# Patient Record
Sex: Female | Born: 1975 | Race: Black or African American | Hispanic: No | Marital: Single | Smoking: Never smoker
Health system: Southern US, Community
[De-identification: ages and names within clinical notes are randomized; demographics above are authoritative.]

## PROBLEM LIST (undated history)

## (undated) DIAGNOSIS — D649 Anemia, unspecified: Secondary | ICD-10-CM

---

## 2019-08-05 ENCOUNTER — Ambulatory Visit: Payer: Self-pay

## 2019-12-28 ENCOUNTER — Ambulatory Visit: Payer: Self-pay | Attending: Internal Medicine

## 2019-12-28 DIAGNOSIS — Z23 Encounter for immunization: Secondary | ICD-10-CM

## 2019-12-28 NOTE — Progress Notes (Signed)
   Covid-19 Vaccination Clinic  Name:  Julie Larson    MRN: [id number] DOB: 22-Jan-1976  12/28/2019  [name] was observed post Covid-19 immunization for 15 minutes without incident. She was provided with Vaccine Information Sheet and instruction to access the V-Safe system.   [name] was instructed to call 911 with any severe reactions post vaccine: Marland Kitchen Difficulty breathing  . Swelling of face and throat  . A fast heartbeat  . A bad rash all over body  . Dizziness and weakness   Immunizations Administered    Name Date Dose VIS Date Route   Moderna COVID-19 Vaccine 12/28/2019  2:35 PM 0.5 mL 04/2019 Intramuscular   Manufacturer: Moderna   Lot: [id number]   [place]: [id number]

## 2020-01-25 ENCOUNTER — Ambulatory Visit: Payer: Self-pay | Attending: Critical Care Medicine

## 2020-01-25 DIAGNOSIS — Z23 Encounter for immunization: Secondary | ICD-10-CM

## 2020-01-25 NOTE — Progress Notes (Signed)
   Covid-19 Vaccination Clinic  Name:  Julie Larson    MRN: [id number] DOB: 16-Jun-1975  01/25/2020  Julie Larson was observed post Covid-19 immunization for 15 minutes without incident. She was provided with Vaccine Information Sheet and instruction to access the V-Safe system.   Julie Larson was instructed to call 911 with any severe reactions post vaccine: Marland Kitchen Difficulty breathing  . Swelling of face and throat  . A fast heartbeat  . A bad rash all over body  . Dizziness and weakness   Immunizations Administered    Name Date Dose VIS Date Route   Moderna COVID-19 Vaccine 01/25/2020  2:02 PM 0.5 mL 04/2019 Intramuscular   Manufacturer: Moderna   Lot: [id number]   [place]: [id number]

## 2020-08-17 ENCOUNTER — Other Ambulatory Visit: Payer: Self-pay

## 2020-08-17 ENCOUNTER — Emergency Department (HOSPITAL_COMMUNITY): Payer: BC Managed Care – PPO

## 2020-08-17 ENCOUNTER — Encounter (HOSPITAL_COMMUNITY): Payer: Self-pay | Admitting: Internal Medicine

## 2020-08-17 ENCOUNTER — Inpatient Hospital Stay (HOSPITAL_COMMUNITY)
Admission: EM | Admit: 2020-08-17 | Discharge: 2020-08-19 | DRG: 812 | Disposition: A | Discharge status: Home / Self Care | Payer: BC Managed Care – PPO | Attending: Internal Medicine | Admitting: Internal Medicine

## 2020-08-17 DIAGNOSIS — R109 Unspecified abdominal pain: Secondary | ICD-10-CM

## 2020-08-17 DIAGNOSIS — R7989 Other specified abnormal findings of blood chemistry: Secondary | ICD-10-CM

## 2020-08-17 DIAGNOSIS — D649 Anemia, unspecified: Secondary | ICD-10-CM | POA: Diagnosis not present

## 2020-08-17 DIAGNOSIS — Z20822 Contact with and (suspected) exposure to covid-19: Secondary | ICD-10-CM | POA: Diagnosis present

## 2020-08-17 DIAGNOSIS — Z7951 Long term (current) use of inhaled steroids: Secondary | ICD-10-CM

## 2020-08-17 DIAGNOSIS — D75839 Thrombocytosis, unspecified: Secondary | ICD-10-CM | POA: Diagnosis not present

## 2020-08-17 DIAGNOSIS — D75838 Other thrombocytosis: Secondary | ICD-10-CM | POA: Diagnosis present

## 2020-08-17 DIAGNOSIS — D271 Benign neoplasm of left ovary: Secondary | ICD-10-CM

## 2020-08-17 DIAGNOSIS — R0602 Shortness of breath: Secondary | ICD-10-CM

## 2020-08-17 DIAGNOSIS — R600 Localized edema: Secondary | ICD-10-CM

## 2020-08-17 DIAGNOSIS — E8770 Fluid overload, unspecified: Secondary | ICD-10-CM | POA: Diagnosis present

## 2020-08-17 DIAGNOSIS — R1110 Vomiting, unspecified: Secondary | ICD-10-CM

## 2020-08-17 DIAGNOSIS — R188 Other ascites: Secondary | ICD-10-CM | POA: Diagnosis present

## 2020-08-17 DIAGNOSIS — D500 Iron deficiency anemia secondary to blood loss (chronic): Principal | ICD-10-CM | POA: Diagnosis present

## 2020-08-17 DIAGNOSIS — R112 Nausea with vomiting, unspecified: Secondary | ICD-10-CM

## 2020-08-17 DIAGNOSIS — N920 Excessive and frequent menstruation with regular cycle: Secondary | ICD-10-CM | POA: Diagnosis present

## 2020-08-17 DIAGNOSIS — D259 Leiomyoma of uterus, unspecified: Secondary | ICD-10-CM | POA: Diagnosis present

## 2020-08-17 DIAGNOSIS — D509 Iron deficiency anemia, unspecified: Secondary | ICD-10-CM | POA: Diagnosis not present

## 2020-08-17 DIAGNOSIS — I509 Heart failure, unspecified: Secondary | ICD-10-CM

## 2020-08-17 DIAGNOSIS — E876 Hypokalemia: Secondary | ICD-10-CM | POA: Diagnosis present

## 2020-08-17 LAB — CBC WITH DIFFERENTIAL/PLATELET
Abs Immature Granulocytes: 0.09 10*3/uL — ABNORMAL HIGH (ref 0.00–0.07)
Basophils Absolute: 0 10*3/uL (ref 0.0–0.1)
Basophils Relative: 0 %
Eosinophils Absolute: 0.1 10*3/uL (ref 0.0–0.5)
Eosinophils Relative: 1 %
HCT: 9.8 % — ABNORMAL LOW (ref 36.0–46.0)
Hemoglobin: 2.5 g/dL — CL (ref 12.0–15.0)
Immature Granulocytes: 1 %
Lymphocytes Relative: 14 %
Lymphs Abs: 1.4 10*3/uL (ref 0.7–4.0)
MCH: 16.8 pg — ABNORMAL LOW (ref 26.0–34.0)
MCHC: 25.5 g/dL — ABNORMAL LOW (ref 30.0–36.0)
MCV: 65.8 fL — ABNORMAL LOW (ref 80.0–100.0)
Monocytes Absolute: 1.1 10*3/uL — ABNORMAL HIGH (ref 0.1–1.0)
Monocytes Relative: 11 %
Neutro Abs: 7.8 10*3/uL — ABNORMAL HIGH (ref 1.7–7.7)
Neutrophils Relative %: 73 %
Platelets: 1235 10*3/uL (ref 150–400)
RBC: 1.49 MIL/uL — ABNORMAL LOW (ref 3.87–5.11)
RDW: 38.3 % — ABNORMAL HIGH (ref 11.5–15.5)
WBC: 10.4 10*3/uL (ref 4.0–10.5)
nRBC: 0.4 % — ABNORMAL HIGH (ref 0.0–0.2)

## 2020-08-17 LAB — PROTIME-INR
INR: 1.2 (ref 0.8–1.2)
Prothrombin Time: 14.8 seconds (ref 11.4–15.2)

## 2020-08-17 LAB — RESP PANEL BY RT-PCR (FLU A&B, COVID) ARPGX2
Influenza A by PCR: NEGATIVE
Influenza B by PCR: NEGATIVE
SARS Coronavirus 2 by RT PCR: NEGATIVE

## 2020-08-17 LAB — COMPREHENSIVE METABOLIC PANEL
ALT: 29 U/L (ref 0–44)
AST: 19 U/L (ref 15–41)
Albumin: 3.4 g/dL — ABNORMAL LOW (ref 3.5–5.0)
Alkaline Phosphatase: 79 U/L (ref 38–126)
Anion gap: 8 (ref 5–15)
BUN: <5 mg/dL — ABNORMAL LOW (ref 6–20)
CO2: 24 mmol/L (ref 22–32)
Calcium: 8.4 mg/dL — ABNORMAL LOW (ref 8.9–10.3)
Chloride: 106 mmol/L (ref 98–111)
Creatinine, Ser: 0.63 mg/dL (ref 0.44–1.00)
GFR, Estimated: >60 mL/min (ref >60)
Glucose, Bld: 112 mg/dL — ABNORMAL HIGH (ref 70–99)
Potassium: 3 mmol/L — ABNORMAL LOW (ref 3.5–5.1)
Sodium: 138 mmol/L (ref 135–145)
Total Bilirubin: 0.5 mg/dL (ref 0.3–1.2)
Total Protein: 7.3 g/dL (ref 6.5–8.1)

## 2020-08-17 LAB — PREPARE RBC (CROSSMATCH)

## 2020-08-17 LAB — ABO/RH: ABO/RH(D): A POS

## 2020-08-17 LAB — RETICULOCYTES
Immature Retic Fract: 12.6 % (ref 2.3–15.9)
RBC.: 1.43 MIL/uL — ABNORMAL LOW (ref 3.87–5.11)
Retic Count, Absolute: 13.3 10*3/uL — ABNORMAL LOW (ref 19.0–186.0)
Retic Ct Pct: 0.9 % (ref 0.4–3.1)

## 2020-08-17 LAB — FOLATE: Folate: 25.7 ng/mL (ref >5.9)

## 2020-08-17 LAB — IRON AND TIBC
Iron: 7 ug/dL — ABNORMAL LOW (ref 28–170)
Saturation Ratios: 1 % — ABNORMAL LOW (ref 10.4–31.8)
TIBC: 475 ug/dL — ABNORMAL HIGH (ref 250–450)
UIBC: 468 ug/dL

## 2020-08-17 LAB — BRAIN NATRIURETIC PEPTIDE: B Natriuretic Peptide: 295.9 pg/mL — ABNORMAL HIGH (ref 0.0–100.0)

## 2020-08-17 LAB — I-STAT BETA HCG BLOOD, ED (MC, WL, AP ONLY): I-stat hCG, quantitative: <5 m[IU]/mL (ref <5)

## 2020-08-17 LAB — VITAMIN B12: Vitamin B-12: 633 pg/mL (ref 180–914)

## 2020-08-17 LAB — FERRITIN: Ferritin: 2 ng/mL — ABNORMAL LOW (ref 11–307)

## 2020-08-17 MED ORDER — SODIUM CHLORIDE 0.9 % IV SOLN
25.0000 mg | Freq: Once | INTRAVENOUS | Status: DC
  Filled 2020-08-17: qty 0.5

## 2020-08-17 MED ORDER — SODIUM CHLORIDE 0.9 % IV SOLN
1500.0000 mg | Freq: Once | INTRAVENOUS | Status: AC
  Administered 2020-08-18: 1500 mg via INTRAVENOUS
  Filled 2020-08-17 (×3): qty 30

## 2020-08-17 MED ORDER — PANTOPRAZOLE SODIUM 40 MG IV SOLR
40.0000 mg | Freq: Once | INTRAVENOUS | Status: AC
  Administered 2020-08-17: 40 mg via INTRAVENOUS
  Filled 2020-08-17: qty 40

## 2020-08-17 MED ORDER — SODIUM CHLORIDE 0.9 % IV SOLN
1500.0000 mg | Freq: Once | INTRAVENOUS | Status: DC
  Filled 2020-08-17: qty 30

## 2020-08-17 MED ORDER — SODIUM CHLORIDE 0.9 % IV SOLN
25.0000 mg | Freq: Once | INTRAVENOUS | Status: AC
  Administered 2020-08-18: 25 mg via INTRAVENOUS
  Filled 2020-08-17: qty 0.5

## 2020-08-17 MED ORDER — ACETAMINOPHEN 650 MG RE SUPP: 650.0000 mg | Freq: Four times a day (QID) | RECTAL | Status: DC | PRN

## 2020-08-17 MED ORDER — POTASSIUM CHLORIDE CRYS ER 20 MEQ PO TBCR
40.0000 meq | EXTENDED_RELEASE_TABLET | ORAL | Status: AC
  Administered 2020-08-17 – 2020-08-18 (×2): 40 meq via ORAL
  Filled 2020-08-17 (×2): qty 2

## 2020-08-17 MED ORDER — FOLIC ACID 1 MG PO TABS
2.0000 mg | ORAL_TABLET | Freq: Every day | ORAL | Status: DC
  Administered 2020-08-18 – 2020-08-19 (×2): 2 mg via ORAL
  Filled 2020-08-17 (×2): qty 2

## 2020-08-17 MED ORDER — ACETAMINOPHEN 325 MG PO TABS: 650.0000 mg | ORAL_TABLET | Freq: Four times a day (QID) | ORAL | Status: DC | PRN

## 2020-08-17 MED ORDER — FERROUS GLUCONATE 324 (38 FE) MG PO TABS
324.0000 mg | ORAL_TABLET | Freq: Every day | ORAL | Status: DC
  Administered 2020-08-18 – 2020-08-19 (×2): 324 mg via ORAL
  Filled 2020-08-17 (×2): qty 1

## 2020-08-17 MED ORDER — SODIUM CHLORIDE 0.9 % IV SOLN
10.0000 mL/h | Freq: Once | INTRAVENOUS | Status: AC
  Administered 2020-08-17: 10 mL/h via INTRAVENOUS

## 2020-08-17 MED ORDER — ENOXAPARIN SODIUM 40 MG/0.4ML ~~LOC~~ SOLN: 40.0000 mg | SUBCUTANEOUS | Status: DC

## 2020-08-17 MED ORDER — FUROSEMIDE 20 MG PO TABS
20.0000 mg | ORAL_TABLET | Freq: Once | ORAL | Status: AC
  Administered 2020-08-18: 20 mg via ORAL
  Filled 2020-08-17: qty 1

## 2020-08-17 MED ORDER — SODIUM CHLORIDE 0.9 % IV SOLN
40.0000 mg | Freq: Two times a day (BID) | INTRAVENOUS | Status: DC
  Filled 2020-08-17: qty 4

## 2020-08-17 NOTE — H&P (Signed)
History and Physical    Julie Larson FAO:[id number] DOB: 12/07/1975 DOA: 08/17/2020  PCP: Lucianne Lei, MD (Confirm with patient/family/NH records and if not entered, this has to be entered at [facility] point of entry) Patient coming from: Home  I have personally briefly reviewed patient's old medical records in [facility]  Chief Complaint: SOB  HPI: Julie Larson is a 45 y.o. female with medical history significant of menorrhagia, presented with, increasing shortness of breath.  Patient has history of heavy menstrual for last 2 years, menstrual period usually last 7 to 10 days, with 3 days very heavy along with low abdominal cramping.  She never set up her primary care so she never went to seek medical attention.  For last 4-month, patient has experienced increasing fatigue and shortness of breath on climbing stairs.  She denied any new abdominal pain, no blood in stool or dark colored stool.  She also admitted occasionally coughing up whitish colored sputum, and she will developed leg swelling over the last couple months.  She went to see her PCP yesterday, PCP diagnosed her with the asthma and prescribed her with inhalers and blood work showed hemoglobin 2.4.  ED Course: Hb 2.5, WBC 1.4, platelet 1235.  Checks x-ray showed lung congested.  Review of Systems: As per HPI otherwise 10 point review of systems negative.   No past medical history on file.     has no history on file for tobacco use, alcohol use, and drug use.  No Known Allergies  No family history on file.   Prior to Admission medications   Medication Sig Start Date End Date Taking? Authorizing Provider  Fluticasone-Umeclidin-Vilant (TRELEGY ELLIPTA) 100-62.5-25 MCG/INH AEPB Inhale 1 puff into the lungs daily.   Yes [provider]  albuterol (VENTOLIN HFA) 108 (90 Base) MCG/ACT inhaler Inhale 1-2 puffs into the lungs as needed for wheezing or shortness of breath. Patient not taking: No sig reported 08/16/20    [provider]  ondansetron (ZOFRAN) 4 MG tablet Take 4 mg by mouth every 8 (eight) hours as needed for nausea/vomiting. Patient not taking: No sig reported 08/16/20   [provider]    Physical Exam: Vitals:   08/17/20 1815 08/17/20 1830 08/17/20 1845 08/17/20 1904  BP: 108/67 108/64 109/60 110/62  Pulse: (!) 102 97 92 88  Resp: 16 (!) 28 16 18   Temp:    98.2 F (36.8 C)  TempSrc:    Oral  SpO2: 100% 100% 100% 100%  Weight:      Height:        Constitutional: NAD, calm, comfortable Vitals:   08/17/20 1815 08/17/20 1830 08/17/20 1845 08/17/20 1904  BP: 108/67 108/64 109/60 110/62  Pulse: (!) 102 97 92 88  Resp: 16 (!) 28 16 18   Temp:    98.2 F (36.8 C)  TempSrc:    Oral  SpO2: 100% 100% 100% 100%  Weight:      Height:       Eyes: PERRL, lids and conjunctivae pale ENMT: Mucous membranes are moist. Posterior pharynx clear of any exudate or lesions.Normal dentition.  Neck: normal, supple, no masses, no thyromegaly Respiratory: clear to auscultation bilaterally, no wheezing, fine crackles on bilateral bases. Normal respiratory effort. No accessory muscle use.  Cardiovascular: Regular rate and rhythm, no murmurs / rubs / gallops. 1+ extremity edema. 2+ pedal pulses. No carotid bruits.  Abdomen: no tenderness, no masses palpated. No hepatosplenomegaly. Bowel sounds positive.  Musculoskeletal: no clubbing / cyanosis. No joint deformity  upper and lower extremities. Good ROM, no contractures. Normal muscle tone.  Skin: no rashes, lesions, ulcers. No induration Neurologic: CN 2-12 grossly intact. Sensation intact, DTR normal. Strength 5/5 in all 4.  Psychiatric: Normal judgment and insight. Alert and oriented x 3. Normal mood.     Labs on Admission: I have personally reviewed following labs and imaging studies  CBC: Recent Labs  Lab 08/17/20 1511  WBC 10.4  NEUTROABS 7.8*  HGB 2.5*  HCT 9.8*  MCV 65.8*  PLT 0,160*   Basic Metabolic Panel: Recent  Labs  Lab 08/17/20 1511  NA 138  K 3.0*  CL 106  CO2 24  GLUCOSE 112*  BUN <5*  CREATININE 0.63  CALCIUM 8.4*   GFR: Estimated Creatinine Clearance: 63.8 mL/min (by C-G formula based on SCr of 0.63 mg/dL). Liver Function Tests: Recent Labs  Lab 08/17/20 1511  AST 19  ALT 29  ALKPHOS 79  BILITOT 0.5  PROT 7.3  ALBUMIN 3.4*   No results for input(s): LIPASE, AMYLASE in the last 168 hours. No results for input(s): AMMONIA in the last 168 hours. Coagulation Profile: Recent Labs  Lab 08/17/20 1511  INR 1.2   Cardiac Enzymes: No results for input(s): CKTOTAL, CKMB, CKMBINDEX, TROPONINI in the last 168 hours. BNP (last 3 results) No results for input(s): PROBNP in the last 8760 hours. HbA1C: No results for input(s): HGBA1C in the last 72 hours. CBG: No results for input(s): GLUCAP in the last 168 hours. Lipid Profile: No results for input(s): CHOL, HDL, LDLCALC, TRIG, CHOLHDL, LDLDIRECT in the last 72 hours. Thyroid Function Tests: No results for input(s): TSH, T4TOTAL, FREET4, T3FREE, THYROIDAB in the last 72 hours. Anemia Panel: Recent Labs    08/17/20 1523  VITAMINB12 633  FOLATE 25.7  FERRITIN 2*  TIBC 475*  IRON 7*  RETICCTPCT 0.9   Urine analysis: No results found for: COLORURINE, APPEARANCEUR, LABSPEC, PHURINE, GLUCOSEU, HGBUR, BILIRUBINUR, KETONESUR, PROTEINUR, UROBILINOGEN, NITRITE, LEUKOCYTESUR  Radiological Exams on Admission: DG Chest Port 1 View  Result Date: 08/17/2020 CLINICAL DATA:  Peripheral swelling and decreased hemoglobin EXAM: PORTABLE CHEST 1 VIEW COMPARISON:  None. FINDINGS: Cardiac shadow is mildly enlarged. Lungs are well aerated bilaterally with small pleural effusions bilaterally. No focal infiltrate is seen. No bony abnormality is noted. IMPRESSION: Small effusions bilaterally. Electronically Signed   By: Inez Catalina M.D.   On: 08/17/2020 16:13    EKG: Independently reviewed.  Sinus tachycardia  Assessment/Plan Active  Problems:   Anemia  (please populate well all problems here in Problem List. (For example, if patient is on BP meds at home and you resume or decide to hold them, it is a problem that needs to be her. 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 -Discussed with patient regarding use of birth control pill, patient does have family-planning, will defer to PCP/OB.  Refer to outpatient OB follow-up. Ultrasound pending. -PRBC x2, and transfusion x1, 1 dose of Lasix bolus.  Start p.o. iron tomorrow.  Decompensated CHF -Likely high-output CHF secondary to chronic severe anemia. -Some signs of fluid overload, crackles in the lungs and peripheral edema.  1 dose of Lasix after transfusion and reevaluate tomorrow. -Recommend follow-up with PCP in 1 to 2 weeks to decide whether she needs echo outpatient. -Repeat chest x-ray in a.m. -TSH  Hypokalemia -P.o. replacement -Recheck level in a.m., check magnesium and  phosphorus level.  Menorrhagia and dysmenorrhea -Refer to outpatient OB/GYN.  Question of asthma -Continue PRN inhalers  DVT prophylaxis: SCD Code Status: Full Code Family Communication: None at bedside Disposition Plan: Expect less than 2 midnight hospital stay, likely can go home tomorrow after PRBC transfusion and iron transfusion. Consults called: Hematology Admission status: MedSurg Obs   Lequita Halt MD [facility] Pager [phone]  08/17/2020, 7:11 PM

## 2020-08-17 NOTE — ED Provider Notes (Signed)
Medical screening examination/treatment/procedure(s) were conducted as a shared visit with non-physician practitioner(s) and myself.  I personally evaluated the patient during the encounter.  Clinical Impression:   Final diagnoses:  Iron deficiency anemia, unspecified iron deficiency anemia type  Hypokalemia  Lower extremity edema  Elevated brain natriuretic peptide (BNP) level    This patient is a ill-appearing 45 year old female who up to this point had been otherwise healthy.  She has not seen a doctor in many years but because of progressive fatigue and weakness over the last several months she went to a doctor to establish care, blood work was drawn which showed a hemoglobin that was severely depleted at less than 3 and she was redirected to the emergency department.  She actually endorses having some shortness of breath with exertion, some lightheadedness and weakness, on exam the patient is tachycardic to 115, has mild edema, has JVD in the semirecumbent position, her abdomen is mildly tender in the lower abdomen mostly on the left side her nailbeds are pale, conjunctive are pale, mucous membranes are pale.  She speaks in full sentences, has normal mentation, her strength is 5 of 5 in all extremities.  This conglomerate of symptoms really is suggestive of acute anemia, she does endorse having heavy menstrual cycles for quite some time but has never been checked out for it.  She denies having any gastrointestinal losses and has never had any hematologic diseases.  We will get lab studies, chest x-ray, IV access, transfusion, admit, this patient is critically ill with severe anemia  Mulriple lab abnormalities, needs admit with w/u for source.  Critically ill   Noemi Chapel, MD 08/18/20 [phone]

## 2020-08-17 NOTE — Consult Note (Signed)
Referral MD  Reason for Referral: Severe microcytic anemia-iron deficiency  Chief Complaint  Patient presents with  . Low Hemoglobin  : I just been feeling tired.  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  She is a Programme researcher, broadcasting/film/video at [employer].  She has been there for 5 years.  She was in [place] where she did her collegiate years.  She has never seen a doctor.  I am just surprised by this.  She has never had a mammogram.  She was just feeling tired and weak.  She ultimately went to see Dr. Criss Rosales who, being incredibly thorough, did blood work on her and obviously found that she was markedly anemic.  Dr. Tamala Julian has not had any bleeding outside of her monthly cycles.  She says that her monthly cycles have not been all that heavy.  She does not recall chewing ice.  She has had a hard time going up her stairs at her home because of shortness of breath and fatigue.  She was brought to the emergency room by paramedics who actually came out to her house.  She was found to be markedly anemic.  Her CBC showed a white count 10.4.  Hemoglobin 2.5.  Retacrit 9.8.  Platelet count 1.2 million.  Her reticulocyte count corrected was basically 0.  She had a normal white blood cell differential.  I looked at her blood smear.  She had some spherocytes.  She had marked anisocytosis and poikilocytosis.  I did not see any nucleated red blood cells.  She had no immature myeloid or lymphoid cells.  I saw no hypersegmented polys.  There were no blasts.  There was no rouleaux formation.  There is no red cell clumping.  Platelets were the increased in number.  She had a couple large platelets.  There is no history of anemia in her family.  She never had surgery.  She has never had problems with smoking.  She I think is rare alcohol use.  Of interest, the fact that she has been throwing up.  She been throwing up since early February.  I am not sure why she has been throwing up.   She has lost weight.  She is not a vegetarian.  However she says she does not eat a lot of meat.  She does not pregnant.  She has never been pregnant.  Currently, her performance status is ECOG 1.    No past medical history on file.::   Current Facility-Administered Medications:  .  famotidine (PEPCID) 40 mg in sodium chloride 0.9 % 50 mL IVPB, 40 mg, Intravenous, Q12H, Almeda Ezra, Rudell Cobb, MD .  folic acid (FOLVITE) tablet 2 mg, 2 mg, Oral, Daily, Xavier Fournier, Rudell Cobb, MD .  Derrill Memo ON 08/18/2020] iron dextran complex (INFED) 25 mg in sodium chloride 0.9 % 50 mL IVPB, 25 mg, Intravenous, Once **FOLLOWED BY** [START ON 08/18/2020] iron dextran complex (INFED) 1,500 mg in sodium chloride 0.9 % 500 mL IVPB, 1,500 mg, Intravenous, Once, Kayline Sheer, Rudell Cobb, MD  Current Outpatient Medications:  .  Fluticasone-Umeclidin-Vilant (TRELEGY ELLIPTA) 100-62.5-25 MCG/INH AEPB, Inhale 1 puff into the lungs daily., Disp: , Rfl:  .  albuterol (VENTOLIN HFA) 108 (90 Base) MCG/ACT inhaler, Inhale 1-2 puffs into the lungs as needed for wheezing or shortness of breath. (Patient not taking: No sig reported), Disp: , Rfl:  .  ondansetron (ZOFRAN) 4 MG tablet, Take 4 mg by mouth every 8 (eight) hours as needed for nausea/vomiting. (Patient  not taking: No sig reported), Disp: , Rfl: :  . folic acid  2 mg Oral Daily  :  No Known Allergies:  No family history on file.:  Social History   Socioeconomic History  . Marital status: Single    Spouse name: Not on file  . Number of children: Not on file  . Years of education: Not on file  . Highest education level: Not on file  Occupational History  . Not on file  Tobacco Use  . Smoking status: Not on file  . Smokeless tobacco: Not on file  Substance and Sexual Activity  . Alcohol use: Not on file  . Drug use: Not on file  . Sexual activity: Not on file  Other Topics Concern  . Not on file  Social History Narrative  . Not on file   Social Determinants of Health    Financial Resource Strain: Not on file  Food Insecurity: Not on file  Transportation Needs: Not on file  Physical Activity: Not on file  Stress: Not on file  Social Connections: Not on file  Intimate Partner Violence: Not on file  :  Review of Systems  Constitutional: Positive for malaise/fatigue and weight loss.  HENT: Negative.   Eyes: Negative.   Respiratory: Negative.   Cardiovascular: Negative.   Gastrointestinal: Positive for abdominal pain and vomiting.  Genitourinary: Negative.   Musculoskeletal: Negative.   Skin: Negative.   Neurological: Negative.   Endo/Heme/Allergies: Negative.   Psychiatric/Behavioral: Negative.      Exam:  This is a fairly well-developed and well-nourished African-American female in no obvious distress.  Vital signs show a temperature of 97.  Pulse 106.  Blood pressure 108/60.  Weight is 110 pounds.  Head neck exam shows pale conjunctiva.  There is no scleral icterus.  She has no adenopathy in the neck.  Thyroid is nonpalpable.  Lungs are clear bilaterally.  Cardiac exam is tachycardic but regular.  She has a 1/6 systolic ejection murmur.  Abdomen is soft.  Bowel sounds are decreased.  There is no guarding or rebound tenderness.  She has no obvious fluid wave.  There is no obvious abdominal mass.  There is no palpable liver or spleen tip.  Back exam shows no tenderness over the spine, ribs or hips.  Extremities show some swelling in the lower extremities close to her ankles.  She has good pulses in her distal extremities.  Neurological exam is nonfocal.  She is intact to sensation.  She has good range of motion of her joints.  Cranial nerves are intact.  Skin exam shows no rashes, ecchymoses or petechia.  Patient Vitals for the past 24 hrs:  BP Temp Temp src Pulse Resp SpO2 Height Weight  08/17/20 1745 108/60 -- -- (!) 106 (!) 22 100 % -- --  08/17/20 1741 110/64 98.2 F (36.8 C) Oral (!) 106 (!) 22 100 % -- --  08/17/20 1730 (!) 100/57 -- -- (!)  108 18 100 % -- --  08/17/20 1726 108/60 98.2 F (36.8 C) Oral (!) 109 18 100 % -- --  08/17/20 1715 (!) 102/59 -- -- (!) 110 15 100 % -- --  08/17/20 1615 119/69 -- -- (!) 116 18 100 % -- --  08/17/20 1600 112/67 -- -- (!) 121 (!) 24 100 % -- --  08/17/20 1545 120/69 -- -- (!) 118 14 100 % -- --  08/17/20 1536 102/66 -- -- (!) 111 (!) 23 100 % -- --  08/17/20 1517  112/61 -- -- (!) 114 18 100 % -- --  08/17/20 1502 -- -- -- -- -- -- 5' (1.524 m) 110 lb (49.9 kg)  08/17/20 1500 -- 98.6 F (37 C) Oral -- -- -- -- --  08/17/20 1459 -- -- -- -- -- 100 % -- --     Recent Labs    08/17/20 1511  WBC 10.4  HGB 2.5*  HCT 9.8*  PLT 1,235*   Recent Labs    08/17/20 1511  NA 138  K 3.0*  CL 106  CO2 24  GLUCOSE 112*  BUN <5*  CREATININE 0.63  CALCIUM 8.4*    Blood smear review: See above  Pathology: None    Assessment and Plan: 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  She is a professor in Education officer, museum at [employer].  By her blood smear by her labs, she clearly is markedly iron deficient.  I think it is interesting that she has this vomiting.  I am not sure as to why she would have this but this could certainly be part of the problem.  She is being transfused with blood.  She definitely needs IV iron.  Given her marked iron deficiency, I would have to give her iron dextran.  With iron dextran, I give her a good amount of iron to try to replace her iron stores.  She may have a hemoglobinopathy.  She may have thalassemia given that she is from [place].  We are checking a hemoglobin electrophoresis on her.  Her thrombocytosis is reactive.  I do not believe that she has a myeloproliferative disorder.  As such, I do not think she needs to have a bone marrow biopsy done.  I do believe that she is going need folic acid.  This often helps.  We will have to see what her other studies show.  Again I am not sure why she has vomiting.  I am sure the primary team  will evaluate this.  She is very very nice.  She is obviously incredibly intelligent.  It was fun talking to her.  We will follow her along.   Lattie Haw, MD  Darlyn Chamber 29:11

## 2020-08-17 NOTE — ED Provider Notes (Signed)
[facility] Provider Note   CSN: [id number] Arrival date & time: 08/17/20  1458    History Chief Complaint  Patient presents with  . Low Hemoglobin    Julie Larson is a 45 y.o. female with past medical history who presents for evaluation of anemia.  Has had generalized weakness over the last few months.  Went to establish care with a primary care provider yesterday.  Had labs drawn.  Was noted to have hemoglobin 2.4.  Patient does endorse that she has intermittent shortness of breath as well as cough productive of white sputum.  She denies any prior history of cardiac or pulmonary issues.  She was previously on iron however they made her constipated and she stopped taking this.  She denies any prior history of anemia.  Does state that history of heavy menstrual cycles.  Her last menstrual cycle stopped 10 days ago.  Currently has 25-day cycles.  States she typically changes her pad every 30 minutes. Does note persistent lower extremity edema over the last few months.  No prior history of heart failure.  Denies headache, chest pain, hemoptysis, abdominal pain, diarrhea, dysuria, unilateral leg swelling, redness or warmth.  Denies additional aggravating or alleviating factors.  Does not take any medications daily  History obtained from patient and past medical records.  No interpreter used  HPI     No past medical history on file.  Patient Active Problem List   Diagnosis Date Noted  . Anemia 08/17/2020   History reviewed   OB History   No obstetric history on file.     No family history on file.     Home Medications Prior to Admission medications   Medication Sig Start Date End Date Taking? Authorizing Provider  Fluticasone-Umeclidin-Vilant (TRELEGY ELLIPTA) 100-62.5-25 MCG/INH AEPB Inhale 1 puff into the lungs daily.   Yes [provider]  albuterol (VENTOLIN HFA) 108 (90 Base) MCG/ACT inhaler Inhale 1-2 puffs into the lungs as  needed for wheezing or shortness of breath. Patient not taking: No sig reported 08/16/20   [provider]  ondansetron (ZOFRAN) 4 MG tablet Take 4 mg by mouth every 8 (eight) hours as needed for nausea/vomiting. Patient not taking: No sig reported 08/16/20   [provider]    Allergies    Patient has no known allergies.  Review of Systems   Review of Systems  Constitutional: Positive for activity change and fatigue.  HENT: Negative.   Respiratory: Positive for cough and shortness of breath. Negative for choking, chest tightness, wheezing and stridor.   Cardiovascular: Positive for leg swelling. Negative for chest pain and palpitations.  Gastrointestinal: Negative.   Genitourinary: Positive for menstrual problem.  Musculoskeletal: Negative.   Skin: Positive for pallor.  Neurological: Positive for weakness and light-headedness.  All other systems reviewed and are negative.   Physical Exam Updated Vital Signs BP 110/62   Pulse 88   Temp 98.2 F (36.8 C) (Oral)   Resp 18   Ht 5' (1.524 m)   Wt 49.9 kg   SpO2 100%   BMI 21.48 kg/m   Physical Exam Vitals and nursing note reviewed.  Constitutional:      General: She is not in acute distress.    Appearance: She is well-developed. She is ill-appearing. She is not toxic-appearing or diaphoretic.  HENT:     Head: Normocephalic and atraumatic.     Mouth/Throat:     Mouth: Mucous membranes are pale.  Pharynx: Oropharynx is clear. Uvula midline.     Comments: Pale mucous membranes. Tongue midline Eyes:     Pupils: Pupils are equal, round, and reactive to light.     Comments: Pale conjuncitva  Neck:     Trachea: Trachea and phonation normal.     Comments: Full TOM neck Cardiovascular:     Rate and Rhythm: Regular rhythm. Tachycardia present.     Pulses: Normal pulses.          Radial pulses are 2+ on the right side and 2+ on the left side.       Dorsalis pedis pulses are 2+ on the right side and 2+ on  the left side.     Heart sounds: Normal heart sounds.     Comments: Slight JVD Pulmonary:     Effort: Pulmonary effort is normal. No respiratory distress.     Breath sounds: Normal breath sounds and air entry.     Comments: Clear bilaterally, Speaks in full sentences without difficulty Chest:     Comments: Equal rise and fall to chest wall Abdominal:     General: Bowel sounds are normal. There is no distension.     Palpations: Abdomen is soft.     Tenderness: There is no abdominal tenderness.     Comments: Soft, nontender  Musculoskeletal:        General: Normal range of motion.     Cervical back: Full passive range of motion without pain, normal range of motion and neck supple.     Comments: Moves all 4 extremities without difficulty.  Compartments soft.  No bony tenderness. Bilateral, symmetric lower extremity edema  Skin:    General: Skin is warm and dry.     Capillary Refill: Capillary refill takes 2 to 3 seconds.     Coloration: Skin is pale.     Comments: Pallor.  No erythema or warmth  Neurological:     General: No focal deficit present.     Mental Status: She is alert.     Cranial Nerves: Cranial nerves are intact.     Sensory: Sensation is intact.     Motor: Motor function is intact.     Gait: Gait is intact.     Comments: Cranial nerves II through grossly intact Ambulatory without difficulty     ED Results / Procedures / Treatments   Labs (all labs ordered are listed, but only abnormal results are displayed) Labs Reviewed  CBC WITH DIFFERENTIAL/PLATELET - Abnormal; Notable for the following components:      Result Value   RBC 1.49 (*)    Hemoglobin 2.5 (*)    HCT 9.8 (*)    MCV 65.8 (*)    MCH 16.8 (*)    MCHC 25.5 (*)    RDW 38.3 (*)    Platelets 1,235 (*)    nRBC 0.4 (*)    Neutro Abs 7.8 (*)    Monocytes Absolute 1.1 (*)    Abs Immature Granulocytes 0.09 (*)    All other components within normal limits  COMPREHENSIVE METABOLIC PANEL - Abnormal;  Notable for the following components:   Potassium 3.0 (*)    Glucose, Bld 112 (*)    BUN <5 (*)    Calcium 8.4 (*)    Albumin 3.4 (*)    All other components within normal limits  BRAIN NATRIURETIC PEPTIDE - Abnormal; Notable for the following components:   B Natriuretic Peptide 295.9 (*)    All other components within normal  limits  IRON AND TIBC - Abnormal; Notable for the following components:   Iron 7 (*)    TIBC 475 (*)    Saturation Ratios 1 (*)    All other components within normal limits  FERRITIN - Abnormal; Notable for the following components:   Ferritin 2 (*)    All other components within normal limits  RETICULOCYTES - Abnormal; Notable for the following components:   RBC. 1.43 (*)    Retic Count, Absolute 13.3 (*)    All other components within normal limits  RESP PANEL BY RT-PCR (FLU A&B, COVID) ARPGX2  PROTIME-INR  VITAMIN B12  FOLATE  URINALYSIS, ROUTINE W REFLEX MICROSCOPIC  ACETAMINOPHEN LEVEL  PATHOLOGIST SMEAR REVIEW  HGB FRACTIONATION CASCADE  HIV ANTIBODY (ROUTINE TESTING W REFLEX)  CBC  I-STAT BETA HCG BLOOD, ED ([facility], [facility], [facility] ONLY)  TYPE AND SCREEN  ABO/RH  PREPARE RBC (CROSSMATCH)    EKG None  Radiology DG Chest Port 1 View  Result Date: 08/17/2020 CLINICAL DATA:  Peripheral swelling and decreased hemoglobin EXAM: PORTABLE CHEST 1 VIEW COMPARISON:  None. FINDINGS: Cardiac shadow is mildly enlarged. Lungs are well aerated bilaterally with small pleural effusions bilaterally. No focal infiltrate is seen. No bony abnormality is noted. IMPRESSION: Small effusions bilaterally. Electronically Signed   By: Inez Catalina M.D.   On: 08/17/2020 16:13    Procedures .Critical Care Performed by: Nettie Elm, PA-C Authorized by: Nettie Elm, PA-C   Critical care provider statement:    Critical care time (minutes):  45   Critical care was necessary to treat or prevent imminent or life-threatening deterioration of the following conditions:   Circulatory failure   Critical care was time spent personally by me on the following activities:  Discussions with consultants, evaluation of patient's response to treatment, examination of patient, ordering and performing treatments and interventions, ordering and review of laboratory studies, ordering and review of radiographic studies, pulse oximetry, re-evaluation of patient's condition, obtaining history from patient or surrogate and review of old charts     Medications Ordered in ED Medications  folic acid (FOLVITE) tablet 2 mg (has no administration in time range)  iron dextran complex (INFED) 25 mg in sodium chloride 0.9 % 50 mL IVPB (has no administration in time range)    Followed by  iron dextran complex (INFED) 1,500 mg in sodium chloride 0.9 % 500 mL IVPB (has no administration in time range)  enoxaparin (LOVENOX) injection 40 mg (has no administration in time range)  acetaminophen (TYLENOL) tablet 650 mg (has no administration in time range)    Or  acetaminophen (TYLENOL) suppository 650 mg (has no administration in time range)  pantoprazole (PROTONIX) injection 40 mg (40 mg Intravenous Given 08/17/20 1624)  0.9 %  sodium chloride infusion (10 mL/hr Intravenous New Bag/Given (Non-Interop) 08/17/20 1725)    ED Course  I have reviewed the triage vital signs and the nursing notes.  Pertinent labs & imaging results that were available during my care of the patient were reviewed by me and considered in my medical decision making (see chart for details).   45 year old here for evaluation of abnormal labs.  She is afebrile, nonseptic appearing.  Patient has diffuse pallor to conjunctiva, mucous membranes.  Does endorse history of heavy menstrual cycles however last menstrual cycle greater than 10 days ago.  No prior work-up for this.  Has had some generalized weakness exertional shortness of breath.  She is some lower extremity edema.  Denies any prior history  of heart failure.  No  current chest pain.  Any lateral leg swelling, redness or warmth to suggest PE.  Plan on labs, imaging and reassess.  Labs and imaging personally reviewed and interpreted:  CBC without leukocytosis, hemoglobin 2.5, platelets 1829 Metabolic panel potassium 3.0, glucose 112 no additional findings COVID negative INR 1.2 Preg negative Ferritin 2 Iron 7 BNP 295  Patient critically ill.  Critically low hemoglobin which she is symptomatic for.  I placed orders for transfusion.  Plan on consult with hematology.  Does have history of heavy menstrual cycles however not currently bleeding.  Hold on pelvic exam.  Plan on adding ultrasound to assess for fibroids as cause of her heavy menstrual cycles.  Will be admitted to hospitalist for further work-up and manage  CONSULT with Dr. Marin Olp with Hematology. Has reviewed labs. Feels patient anemia is likely from iron deficiency.   CONSULT with Dr. Roosevelt Locks with [facility] who will evaluate patient for admission.  The patient appears reasonably stabilized for admission considering the current resources, flow, and capabilities available in the ED at this time, and I doubt any other [facility] requiring further screening and/or treatment in the ED prior to admission.  Patient seen and evaluated by attending, Dr. Sabra Heck who agrees with above treatment, plan and disposition.    MDM Rules/Calculators/A&P                           Final Clinical Impression(s) / ED Diagnoses Final diagnoses:  Iron deficiency anemia, unspecified iron deficiency anemia type  Hypokalemia  Lower extremity edema  Elevated brain natriuretic peptide (BNP) level    Rx / DC Orders ED Discharge Orders    None       Arrielle Mcginn A, PA-C 08/17/20 1910    Noemi Chapel, MD 08/18/20 Joen Laura

## 2020-08-17 NOTE — ED Notes (Signed)
US at bedside

## 2020-08-17 NOTE — ED Triage Notes (Addendum)
Pt BIB [facility] d/t being called by her PCP & them reporting her hemoglobin was 2.4 & that she needed to come to ED for eval. Pt endorses she has had n/v for the last month, +2 swelling in her legs & does have heavy menstrual days, A/Ox4 & verbal upon arrival to ED.

## 2020-08-17 NOTE — ED Notes (Signed)
Consent to receive blood products was obtained electronically just now from pt & was witnessed by this RN.

## 2020-08-18 ENCOUNTER — Observation Stay (HOSPITAL_COMMUNITY): Payer: BC Managed Care – PPO

## 2020-08-18 ENCOUNTER — Inpatient Hospital Stay (HOSPITAL_COMMUNITY): Payer: BC Managed Care – PPO

## 2020-08-18 DIAGNOSIS — D75839 Thrombocytosis, unspecified: Secondary | ICD-10-CM | POA: Diagnosis not present

## 2020-08-18 DIAGNOSIS — E8770 Fluid overload, unspecified: Secondary | ICD-10-CM | POA: Diagnosis present

## 2020-08-18 DIAGNOSIS — N83292 Other ovarian cyst, left side: Secondary | ICD-10-CM

## 2020-08-18 DIAGNOSIS — D75838 Other thrombocytosis: Secondary | ICD-10-CM | POA: Diagnosis present

## 2020-08-18 DIAGNOSIS — E876 Hypokalemia: Secondary | ICD-10-CM | POA: Diagnosis present

## 2020-08-18 DIAGNOSIS — R188 Other ascites: Secondary | ICD-10-CM | POA: Diagnosis present

## 2020-08-18 DIAGNOSIS — D500 Iron deficiency anemia secondary to blood loss (chronic): Principal | ICD-10-CM

## 2020-08-18 DIAGNOSIS — D259 Leiomyoma of uterus, unspecified: Secondary | ICD-10-CM | POA: Diagnosis present

## 2020-08-18 DIAGNOSIS — D649 Anemia, unspecified: Secondary | ICD-10-CM | POA: Diagnosis present

## 2020-08-18 DIAGNOSIS — Z20822 Contact with and (suspected) exposure to covid-19: Secondary | ICD-10-CM | POA: Diagnosis present

## 2020-08-18 DIAGNOSIS — R1110 Vomiting, unspecified: Secondary | ICD-10-CM | POA: Diagnosis present

## 2020-08-18 DIAGNOSIS — D509 Iron deficiency anemia, unspecified: Secondary | ICD-10-CM | POA: Diagnosis not present

## 2020-08-18 DIAGNOSIS — Z7951 Long term (current) use of inhaled steroids: Secondary | ICD-10-CM | POA: Diagnosis not present

## 2020-08-18 DIAGNOSIS — D271 Benign neoplasm of left ovary: Secondary | ICD-10-CM | POA: Diagnosis present

## 2020-08-18 DIAGNOSIS — N920 Excessive and frequent menstruation with regular cycle: Secondary | ICD-10-CM | POA: Diagnosis present

## 2020-08-18 LAB — HIV ANTIBODY (ROUTINE TESTING W REFLEX): HIV Screen 4th Generation wRfx: NONREACTIVE

## 2020-08-18 LAB — URINALYSIS, ROUTINE W REFLEX MICROSCOPIC
Bilirubin Urine: NEGATIVE
Glucose, UA: NEGATIVE mg/dL
Hgb urine dipstick: NEGATIVE
Ketones, ur: 5 mg/dL — AB
Leukocytes,Ua: NEGATIVE
Nitrite: NEGATIVE
Protein, ur: NEGATIVE mg/dL
Specific Gravity, Urine: 1.008 (ref 1.005–1.030)
pH: 7 (ref 5.0–8.0)

## 2020-08-18 LAB — CBC
HCT: 18.5 % — ABNORMAL LOW (ref 36.0–46.0)
Hemoglobin: 6.2 g/dL — CL (ref 12.0–15.0)
MCH: 25.5 pg — ABNORMAL LOW (ref 26.0–34.0)
MCHC: 33.5 g/dL (ref 30.0–36.0)
MCV: 76.1 fL — ABNORMAL LOW (ref 80.0–100.0)
Platelets: 886 10*3/uL — ABNORMAL HIGH (ref 150–400)
RBC: 2.43 MIL/uL — ABNORMAL LOW (ref 3.87–5.11)
RDW: 25.6 % — ABNORMAL HIGH (ref 11.5–15.5)
WBC: 8.5 10*3/uL (ref 4.0–10.5)
nRBC: 0.6 % — ABNORMAL HIGH (ref 0.0–0.2)

## 2020-08-18 LAB — PATHOLOGIST SMEAR REVIEW

## 2020-08-18 LAB — PREPARE RBC (CROSSMATCH)

## 2020-08-18 LAB — BASIC METABOLIC PANEL
Anion gap: 8 (ref 5–15)
BUN: <5 mg/dL — ABNORMAL LOW (ref 6–20)
CO2: 22 mmol/L (ref 22–32)
Calcium: 8.4 mg/dL — ABNORMAL LOW (ref 8.9–10.3)
Chloride: 108 mmol/L (ref 98–111)
Creatinine, Ser: 0.66 mg/dL (ref 0.44–1.00)
GFR, Estimated: >60 mL/min (ref >60)
Glucose, Bld: 85 mg/dL (ref 70–99)
Potassium: 4.1 mmol/L (ref 3.5–5.1)
Sodium: 138 mmol/L (ref 135–145)

## 2020-08-18 LAB — TSH: TSH: 1.863 u[IU]/mL (ref 0.350–4.500)

## 2020-08-18 LAB — HEMOGLOBIN AND HEMATOCRIT, BLOOD
HCT: 26 % — ABNORMAL LOW (ref 36.0–46.0)
Hemoglobin: 8.5 g/dL — ABNORMAL LOW (ref 12.0–15.0)

## 2020-08-18 LAB — MAGNESIUM: Magnesium: 2.2 mg/dL (ref 1.7–2.4)

## 2020-08-18 LAB — ACETAMINOPHEN LEVEL: Acetaminophen (Tylenol), Serum: <10 ug/mL — ABNORMAL LOW (ref 10–30)

## 2020-08-18 LAB — PHOSPHORUS: Phosphorus: 3.7 mg/dL (ref 2.5–4.6)

## 2020-08-18 MED ORDER — IOHEXOL 9 MG/ML PO SOLN
ORAL | Status: AC
  Administered 2020-08-18: 500 mL
  Filled 2020-08-18: qty 1000

## 2020-08-18 MED ORDER — IOHEXOL 300 MG/ML  SOLN
100.0000 mL | Freq: Once | INTRAMUSCULAR | Status: AC | PRN
  Administered 2020-08-18: 100 mL via INTRAVENOUS

## 2020-08-18 MED ORDER — FAMOTIDINE IN NACL 20-0.9 MG/50ML-% IV SOLN
20.0000 mg | Freq: Once | INTRAVENOUS | Status: AC
  Administered 2020-08-18: 20 mg via INTRAVENOUS
  Filled 2020-08-18: qty 50

## 2020-08-18 MED ORDER — ACETAMINOPHEN 325 MG PO TABS
650.0000 mg | ORAL_TABLET | Freq: Once | ORAL | Status: AC
  Administered 2020-08-18: 650 mg via ORAL
  Filled 2020-08-18: qty 2

## 2020-08-18 MED ORDER — SODIUM CHLORIDE 0.9% IV SOLUTION: Freq: Once | INTRAVENOUS | Status: DC

## 2020-08-18 MED ORDER — FUROSEMIDE 10 MG/ML IJ SOLN
20.0000 mg | Freq: Once | INTRAMUSCULAR | Status: AC
  Administered 2020-08-18: 20 mg via INTRAVENOUS
  Filled 2020-08-18: qty 2

## 2020-08-18 MED ORDER — ENSURE ENLIVE PO LIQD: 237.0000 mL | Freq: Two times a day (BID) | ORAL | Status: DC

## 2020-08-18 NOTE — Progress Notes (Signed)
Dr. Tamala Julian is now up on [address].  I know she will get outstanding care from all the staff up there.  She has got her transfusion.  Her hemoglobin is gotten better.  Her labs show a white count of 8.5.  Hemoglobin 6.2.  Platelet count is 2 86,000.  MCV is 76.  She has normal BUN and creatinine.  She will get her iron today.  Patient had a vaginal ultrasound.  This showed a lot of fibroids.  There is a complex cyst with the left ovary.  She has had no vomiting.  I still think she needs to have her abdomen checked.  Now that she is more stable I would order a CT of the abdomen pelvis to see what might be going on with this vomiting.  I do have her on folic acid because of blood loss from her monthly cycles.  I think this is important.  She really needs to get a family doctor.  I know she saw Dr. Criss Rosales.  Hopefully, she will be able to follow-up with Dr. Criss Rosales, who is incredibly thorough and very compassionate with her patients.  Her vital signs are all stable.  Blood pressure 105/79.  Temperature 98.5.  Pulse 78.  There is no change in her physical exam.  Today, she will get the IV iron.  She will get a test dose.  Please make sure that she gets premedicated with Pepcid and Tylenol.  Lattie Haw, MD  Nelly Rout

## 2020-08-18 NOTE — Progress Notes (Signed)
IV team at bedside, patient c/o pain to current IV site, no signs of redness or swelling observed to site, but blood transfusion currently stopped and new IV will be placed by IV team.

## 2020-08-18 NOTE — Progress Notes (Signed)
Blood transfusion restarted to new IV to left arm.

## 2020-08-18 NOTE — Progress Notes (Signed)
PROGRESS NOTE    Julie Larson  ZWC:[id number] DOB: 06/24/1975 DOA: 08/17/2020 PCP: Lucianne Lei, MD     Brief Narrative:  Julie Larson is a 45 y.o. female with medical history significant of menorrhagia, presented with increasing shortness of breath.  Patient has history of heavy menstrual for last 2 years, menstrual period usually last 7 to 10 days, with 3 days very heavy along with low abdominal cramping.  She never set up her primary care so she never went to seek medical attention.  For last 59-month, patient has experienced increasing fatigue and shortness of breath on climbing stairs.  She denied any new abdominal pain, no blood in stool or dark colored stool.  She also admitted occasionally coughing up whitish colored sputum, and she will developed leg swelling over the last couple months.  She went to see her PCP yesterday, PCP diagnosed her with the asthma and prescribed her with inhalers and blood work showed hemoglobin 2.4.   Patient received 2 unit packed red blood cells, hematology consulted for anemia.  New events last 24 hours / Subjective: Patient states that she has had intermittent vomiting episodes since November/December of last year.  Initially, these were correlated with her menstrual cycle and cramping.  However over the past month, this has progressed to her vomiting on a daily basis.  She states that she vomits typically everything she eats, is not associated with any abdominal pain.  She is having normal stools.  She denies any odynophagia.  She does admit to hunger and having an appetite, but unable to keep down food.  Assessment & Plan:   Active Problems:   Anemia   Symptomatic anemia secondary to chronic blood loss, chronic iron deficiency related to menorrhagia -Presented with hemoglobin 2.5 --> 6.2 -Ferritin 2 -Appreciate Dr. Marin Olp -Status post 2 unit packed red blood cell on 3/24.  Give additional unit of blood today 3/25 -IV iron ordered for  today -Patient needs to follow-up with PCP as well as establish with OB/GYN for menorrhagia  Chronic vomiting syndrome -Unclear etiology -CT abdomen pelvis ordered to rule out any intra-abdominal abnormality  Fibroid and ovarian cyst -Pelvis US revealed enlarged fibroid uterus as well as 5.8cm complex left ovarian cyst.  Recommended repeat ultrasound in 3 to 6 months to follow-up on hemorrhagic cyst  Fluid overload -Received IV Lasix yesterday.  Does not appear to be fluid overloaded on my examination today   DVT prophylaxis:  Place and maintain sequential compression device Start: 08/17/20 1912  Code Status: Full code Family Communication: No family at bedside Disposition Plan:  Status is: Observation  The patient will require care spanning > 2 midnights and should be moved to inpatient because: Inpatient level of care appropriate due to severity of illness  Dispo: The patient is from: Home              Anticipated d/c is to: Home              Patient currently is not medically stable to d/c.  Give additional unit of blood today, CT abdomen pelvis ordered to rule out intra-abdominal abnormality.  Hopeful discharge home 3/26   Difficult to place patient No      Consultants:   Hematology  Procedures:   None  Antimicrobials:  Anti-infectives (From admission, onward)   None        Objective: Vitals:   08/17/20 2222 08/18/20 0135 08/18/20 0513 08/18/20 1010  BP: (!) 101/56 111/65 105/79 96/68  Pulse: 79  76 78 63  Resp: 18 17 17 18   Temp: 98.1 F (36.7 C) 99.1 F (37.3 C) 98.5 F (36.9 C) 98.3 F (36.8 C)  TempSrc: Oral Oral Oral Oral  SpO2: 100% 100% 100% 99%  Weight:      Height:        Intake/Output Summary (Last 24 hours) at 08/18/2020 1107 Last data filed at 08/18/2020 0513 Gross per 24 hour  Intake 851 ml  Output 300 ml  Net 551 ml   Filed Weights   08/17/20 1502  Weight: 49.9 kg    Examination:  General exam: Appears calm and comfortable   Respiratory system: Clear to auscultation. Respiratory effort normal. No respiratory distress. No conversational dyspnea.  Cardiovascular system: S1 & S2 heard, RRR. No murmurs. No pedal edema. Gastrointestinal system: Abdomen is nondistended, soft and nontender. Normal bowel sounds heard. Central nervous system: Alert and oriented. No focal neurological deficits. Speech clear.  Extremities: Symmetric in appearance  Skin: No rashes, lesions or ulcers on exposed skin  Psychiatry: Judgement and insight appear normal. Mood & affect appropriate.   Data Reviewed: I have personally reviewed following labs and imaging studies  CBC: Recent Labs  Lab 08/17/20 1511 08/18/20 0403  WBC 10.4 8.5  NEUTROABS 7.8*  --   HGB 2.5* 6.2*  HCT 9.8* 18.5*  MCV 65.8* 76.1*  PLT 1,235* 626*   Basic Metabolic Panel: Recent Labs  Lab 08/17/20 1511 08/18/20 0403  NA 138 138  K 3.0* 4.1  CL 106 108  CO2 24 22  GLUCOSE 112* 85  BUN <5* <5*  CREATININE 0.63 0.66  CALCIUM 8.4* 8.4*  MG  --  2.2  PHOS  --  3.7   GFR: Estimated Creatinine Clearance: 63.8 mL/min (by C-G formula based on SCr of 0.66 mg/dL). Liver Function Tests: Recent Labs  Lab 08/17/20 1511  AST 19  ALT 29  ALKPHOS 79  BILITOT 0.5  PROT 7.3  ALBUMIN 3.4*   No results for input(s): LIPASE, AMYLASE in the last 168 hours. No results for input(s): AMMONIA in the last 168 hours. Coagulation Profile: Recent Labs  Lab 08/17/20 1511  INR 1.2   Cardiac Enzymes: No results for input(s): CKTOTAL, CKMB, CKMBINDEX, TROPONINI in the last 168 hours. BNP (last 3 results) No results for input(s): PROBNP in the last 8760 hours. HbA1C: No results for input(s): HGBA1C in the last 72 hours. CBG: No results for input(s): GLUCAP in the last 168 hours. Lipid Profile: No results for input(s): CHOL, HDL, LDLCALC, TRIG, CHOLHDL, LDLDIRECT in the last 72 hours. Thyroid Function Tests: Recent Labs    08/18/20 0403  TSH 1.863   Anemia  Panel: Recent Labs    08/17/20 1523  VITAMINB12 633  FOLATE 25.7  FERRITIN 2*  TIBC 475*  IRON 7*  RETICCTPCT 0.9   Sepsis Labs: No results for input(s): PROCALCITON, LATICACIDVEN in the last 168 hours.  Recent Results (from the past 240 hour(s))  Resp Panel by RT-PCR (Flu A&B, Covid) Nasopharyngeal Swab     Status: None   Collection Time: 08/17/20  3:11 PM   Specimen: Nasopharyngeal Swab; Nasopharyngeal(NP) swabs in vial transport medium  Result Value Ref Range Status   SARS Coronavirus 2 by RT PCR NEGATIVE NEGATIVE Final    Comment: (NOTE) SARS-CoV-2 target nucleic acids are NOT DETECTED.  The SARS-CoV-2 RNA is generally detectable in upper respiratory specimens during the acute phase of infection. The lowest concentration of SARS-CoV-2 viral copies this assay can detect is 138  copies/mL. A negative result does not preclude SARS-Cov-2 infection and should not be used as the sole basis for treatment or other patient management decisions. A negative result may occur with  improper specimen collection/handling, submission of specimen other than nasopharyngeal swab, presence of viral mutation(s) within the areas targeted by this assay, and inadequate number of viral copies(<138 copies/mL). A negative result must be combined with clinical observations, patient history, and epidemiological information. The expected result is Negative.  Fact Sheet for Patients:  EntrepreneurPulse.com.au  Fact Sheet for Healthcare Providers:  IncredibleEmployment.be  This test is no t yet approved or cleared by the [place] FDA and  has been authorized for detection and/or diagnosis of SARS-CoV-2 by FDA under an Emergency Use Authorization (EUA). This EUA will remain  in effect (meaning this test can be used) for the duration of the COVID-19 declaration under Section 564(b)(1) of the Act, 21 U.S.C.section 360bbb-3(b)(1), unless the authorization is  terminated  or revoked sooner.       Influenza A by PCR NEGATIVE NEGATIVE Final   Influenza B by PCR NEGATIVE NEGATIVE Final    Comment: (NOTE) The Xpert Xpress SARS-CoV-2/FLU/RSV plus assay is intended as an aid in the diagnosis of influenza from Nasopharyngeal swab specimens and should not be used as a sole basis for treatment. Nasal washings and aspirates are unacceptable for Xpert Xpress SARS-CoV-2/FLU/RSV testing.  Fact Sheet for Patients: EntrepreneurPulse.com.au  Fact Sheet for Healthcare Providers: IncredibleEmployment.be  This test is not yet approved or cleared by the [place] FDA and has been authorized for detection and/or diagnosis of SARS-CoV-2 by FDA under an Emergency Use Authorization (EUA). This EUA will remain in effect (meaning this test can be used) for the duration of the COVID-19 declaration under Section 564(b)(1) of the Act, 21 U.S.C. section 360bbb-3(b)(1), unless the authorization is terminated or revoked.  Performed at [facility], [place] [address], [place], [place] [address]       Radiology Studies: US Pelvis Complete  Result Date: 08/17/2020 CLINICAL DATA:  Vaginal bleeding EXAM: TRANSABDOMINAL ULTRASOUND OF PELVIS DOPPLER ULTRASOUND OF OVARIES TECHNIQUE: Transabdominal ultrasound examination of the pelvis was performed including evaluation of the uterus, ovaries, adnexal regions, and pelvic cul-de-sac. Color and duplex Doppler ultrasound was utilized to evaluate blood flow to the ovaries. COMPARISON:  None. FINDINGS: Uterus Measurements: 14 x 7.6 x 9.8 cm = volume: 541 mL. Numerous fibroids measuring up to 5.7 cm in the lower uterine segment. Endometrium Thickness: Difficult to visualize due to the numerous fibroids. Right ovary Measurements: 2.9 x 2.0 x 3.5 cm = volume: 10.2 mL. Normal appearance/no adnexal mass. Left ovary Measurements: 7.5 x 4.1 x 6.1 cm = volume: 98 mL. 5.8 x 5.1 x 3.9 cm complex cyst  with internal echoes. Pulsed Doppler evaluation demonstrates normal low-resistance arterial and venous waveforms in both ovaries. Other: No free fluid. IMPRESSION: Enlarged fibroid uterus. 5.8 cm complex cyst in the left over, likely hemorrhagic cyst. Recommend follow-up US in 3-6 months. Note: This recommendation does not apply to premenarchal patients or to those with increased risk (genetic, family history, elevated tumor markers or other high-risk factors) of ovarian cancer. Reference: Radiology 2019 Nov; 293(2):359-371. Electronically Signed   By: Rolm Baptise M.D.   On: 08/17/2020 19:31   US PELVIC DOPPLER (TORSION R/O OR MASS ARTERIAL FLOW)  Result Date: 08/17/2020 CLINICAL DATA:  Vaginal bleeding EXAM: TRANSABDOMINAL ULTRASOUND OF PELVIS DOPPLER ULTRASOUND OF OVARIES TECHNIQUE: Transabdominal ultrasound examination of the pelvis was performed including evaluation of  the uterus, ovaries, adnexal regions, and pelvic cul-de-sac. Color and duplex Doppler ultrasound was utilized to evaluate blood flow to the ovaries. COMPARISON:  None. FINDINGS: Uterus Measurements: 14 x 7.6 x 9.8 cm = volume: 541 mL. Numerous fibroids measuring up to 5.7 cm in the lower uterine segment. Endometrium Thickness: Difficult to visualize due to the numerous fibroids. Right ovary Measurements: 2.9 x 2.0 x 3.5 cm = volume: 10.2 mL. Normal appearance/no adnexal mass. Left ovary Measurements: 7.5 x 4.1 x 6.1 cm = volume: 98 mL. 5.8 x 5.1 x 3.9 cm complex cyst with internal echoes. Pulsed Doppler evaluation demonstrates normal low-resistance arterial and venous waveforms in both ovaries. Other: No free fluid. IMPRESSION: Enlarged fibroid uterus. 5.8 cm complex cyst in the left over, likely hemorrhagic cyst. Recommend follow-up US in 3-6 months. Note: This recommendation does not apply to premenarchal patients or to those with increased risk (genetic, family history, elevated tumor markers or other high-risk factors) of ovarian cancer.  Reference: Radiology 2019 Nov; 293(2):359-371. Electronically Signed   By: Rolm Baptise M.D.   On: 08/17/2020 19:31   DG Chest Port 1 View  Result Date: 08/18/2020 CLINICAL DATA:  CHF EXAM: PORTABLE CHEST 1 VIEW COMPARISON:  Yesterday FINDINGS: Cardiomegaly. Congested appearance of vessels with interstitial prominence and small pleural effusions. No pneumothorax. Artifact from EKG leads. IMPRESSION: CHF pattern. Electronically Signed   By: Monte Fantasia M.D.   On: 08/18/2020 07:18   DG Chest Port 1 View  Result Date: 08/17/2020 CLINICAL DATA:  Peripheral swelling and decreased hemoglobin EXAM: PORTABLE CHEST 1 VIEW COMPARISON:  None. FINDINGS: Cardiac shadow is mildly enlarged. Lungs are well aerated bilaterally with small pleural effusions bilaterally. No focal infiltrate is seen. No bony abnormality is noted. IMPRESSION: Small effusions bilaterally. Electronically Signed   By: Inez Catalina M.D.   On: 08/17/2020 16:13      Scheduled Meds: . sodium chloride   Intravenous Once  . ferrous gluconate  324 mg Oral Q breakfast  . folic acid  2 mg Oral Daily   Continuous Infusions: . iron dextran (INFED/DEXFERRUM) infusion 1,500 mg (08/18/20 0956)     LOS: 0 days      Time spent: 30 minutes   Dessa Phi, DO [facility] 08/18/2020, 11:07 AM   Available via Epic secure chat 7am-7pm After these hours, please refer to coverage provider listed on [email or web address]

## 2020-08-18 NOTE — Progress Notes (Signed)
Ultrasound at bedside

## 2020-08-18 NOTE — Progress Notes (Signed)
Initial Nutrition Assessment  DOCUMENTATION CODES:   Not applicable  INTERVENTION:   Provided "Iron Deficiency Nutrition Therapy" handout from AND to family.   Ensure Enlive po BID, each supplement provides 350 kcal and 20 grams of protein  NUTRITION DIAGNOSIS:   Inadequate oral intake related to vomiting as evidenced by estimated needs.  GOAL:   Patient will meet greater than or equal to 90% of their needs  MONITOR:   PO intake,Supplement acceptance,Labs,Weight trends  REASON FOR ASSESSMENT:   Malnutrition Screening Tool    ASSESSMENT:   45 yo female admitted with symptomatic anemia (SOB, fatigue) secondary to chronic blood loss, chronic iron deficiency related to menorrhagia. No PMH  Pt out of room on visit today for CT; family in room   Chronic vomiting, CT abdomen pending. Pt has been vomiting regularly since early February  Per report, pt has lost weight. Appetite has been good and pt does feel hungry but often vomits after eating.   No recorded po intake; per family, pt is hungry today, looking forward to lunch meals.   Labs: reviewed, Hgb 6.2 Meds: fergon, folic acid, lasix  NUTRITION - FOCUSED PHYSICAL EXAM:  Unable to assess, pt not available  Diet Order:   Diet Order            Diet regular Room service appropriate? Yes; Fluid consistency: Thin  Diet effective now                 EDUCATION NEEDS:   Education needs have been addressed  Skin:  Skin Assessment: Reviewed RN Assessment  Last BM:  3/25  Height:   Ht Readings from Last 1 Encounters:  08/17/20 5' (1.524 m)    Weight:   Wt Readings from Last 1 Encounters:  08/17/20 49.9 kg    BMI:  Body mass index is 21.48 kg/m.  Estimated Nutritional Needs:   Kcal:  1700-1900 kcals  Protein:  80-90 g  Fluid:  >/= 1.7 L   Kerman Passey MS, RDN, LDN, CNSC Registered Dietitian III Clinical Nutrition RD Pager and On-Call Pager Number Located in [place]

## 2020-08-19 DIAGNOSIS — K819 Cholecystitis, unspecified: Secondary | ICD-10-CM

## 2020-08-19 DIAGNOSIS — R1110 Vomiting, unspecified: Secondary | ICD-10-CM

## 2020-08-19 DIAGNOSIS — N920 Excessive and frequent menstruation with regular cycle: Secondary | ICD-10-CM

## 2020-08-19 DIAGNOSIS — D271 Benign neoplasm of left ovary: Secondary | ICD-10-CM

## 2020-08-19 DIAGNOSIS — D500 Iron deficiency anemia secondary to blood loss (chronic): Secondary | ICD-10-CM

## 2020-08-19 DIAGNOSIS — D649 Anemia, unspecified: Secondary | ICD-10-CM

## 2020-08-19 DIAGNOSIS — E8770 Fluid overload, unspecified: Secondary | ICD-10-CM

## 2020-08-19 DIAGNOSIS — D259 Leiomyoma of uterus, unspecified: Secondary | ICD-10-CM

## 2020-08-19 LAB — TYPE AND SCREEN
ABO/RH(D): A POS
Antibody Screen: NEGATIVE
Unit division: 0
Unit division: 0
Unit division: 0

## 2020-08-19 LAB — CBC WITH DIFFERENTIAL/PLATELET
Abs Immature Granulocytes: 0.23 10*3/uL — ABNORMAL HIGH (ref 0.00–0.07)
Basophils Absolute: 0.1 10*3/uL (ref 0.0–0.1)
Basophils Relative: 1 %
Eosinophils Absolute: 0.2 10*3/uL (ref 0.0–0.5)
Eosinophils Relative: 2 %
HCT: 25 % — ABNORMAL LOW (ref 36.0–46.0)
Hemoglobin: 8.1 g/dL — ABNORMAL LOW (ref 12.0–15.0)
Immature Granulocytes: 2 %
Lymphocytes Relative: 10 %
Lymphs Abs: 1.3 10*3/uL (ref 0.7–4.0)
MCH: 25.6 pg — ABNORMAL LOW (ref 26.0–34.0)
MCHC: 32.4 g/dL (ref 30.0–36.0)
MCV: 79.1 fL — ABNORMAL LOW (ref 80.0–100.0)
Monocytes Absolute: 1.4 10*3/uL — ABNORMAL HIGH (ref 0.1–1.0)
Monocytes Relative: 11 %
Neutro Abs: 9.2 10*3/uL — ABNORMAL HIGH (ref 1.7–7.7)
Neutrophils Relative %: 74 %
Platelets: 805 10*3/uL — ABNORMAL HIGH (ref 150–400)
RBC: 3.16 MIL/uL — ABNORMAL LOW (ref 3.87–5.11)
RDW: 25 % — ABNORMAL HIGH (ref 11.5–15.5)
WBC: 12.1 10*3/uL — ABNORMAL HIGH (ref 4.0–10.5)
nRBC: 2.3 % — ABNORMAL HIGH (ref 0.0–0.2)

## 2020-08-19 LAB — BPAM RBC
Blood Product Expiration Date: 202204132359
Blood Product Expiration Date: 202204132359
Blood Product Expiration Date: 202204192359
ISSUE DATE / TIME: 202203241713
ISSUE DATE / TIME: 202203242158
ISSUE DATE / TIME: 202203251356
Unit Type and Rh: 6200
Unit Type and Rh: 6200
Unit Type and Rh: 6200

## 2020-08-19 LAB — RETICULOCYTES
Immature Retic Fract: 13.8 % (ref 2.3–15.9)
RBC.: 3.6 MIL/uL — ABNORMAL LOW (ref 3.87–5.11)
Retic Count, Absolute: 44.6 10*3/uL (ref 19.0–186.0)
Retic Ct Pct: 1.2 % (ref 0.4–3.1)

## 2020-08-19 MED ORDER — FERROUS GLUCONATE 324 (38 FE) MG PO TABS: 324.0000 mg | ORAL_TABLET | Freq: Every day | ORAL | 2 refills | Status: AC

## 2020-08-19 MED ORDER — FUROSEMIDE 10 MG/ML IJ SOLN
20.0000 mg | Freq: Once | INTRAMUSCULAR | Status: AC
  Administered 2020-08-19: 20 mg via INTRAVENOUS
  Filled 2020-08-19: qty 2

## 2020-08-19 MED ORDER — FOLIC ACID 1 MG PO TABS: 2.0000 mg | ORAL_TABLET | Freq: Every day | ORAL | 2 refills | Status: AC

## 2020-08-19 NOTE — Progress Notes (Signed)
Discharge instructions given to patient. Patient verbalizes understanding. Ivs removed. Patient discharged

## 2020-08-19 NOTE — Progress Notes (Signed)
Dr. 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  Her hemoglobin continues to improve.  This morning, her white count 12.1.  Hemoglobin 8.1.  Platelet count 805,000.  The MCV is coming up nicely.  It is 79.  She did get her iron.  She has had ultrasounds.  She may have cholecystitis.  She has had no nausea vomiting I think since she has been in the hospital.  Again she has profound iron deficiency.  I am sure she will need more in the way of IV iron.  From my point of view, she can certainly be discharged.  Again I am not sure if there is any problems with cholecystitis.  Maybe, because she was so profoundly anemic, this may have led to poor circulation through her intestinal system that may have caused some nausea and vomiting.  Hopefully, she will get follow-up with her family doctor and also with gynecology.  I am sure she might need to see gastroenterology.  If she does go home over the weekend, I would like to see her back in the office in about 3 weeks.  By then, the iron that she received should be working quite nicely.  I am sure she will need some more iron.  I am just glad that she is feeling better.  Lattie Haw, MD  [place] 7:7

## 2020-08-19 NOTE — Discharge Summary (Signed)
Physician Discharge Summary  Julie Larson OZH:[id number] DOB: 1975-08-11 DOA: 08/17/2020  PCP: Lucianne Lei, MD  Admit date: 08/17/2020 Discharge date: 08/19/2020  Admitted From: Home Disposition:  Home  Recommendations for Outpatient Follow-up:  1. Follow up with PCP in 1 week 2. Follow up with Dr. Marin Olp in 3 weeks 3. Repeat CBC in 1 week  4. Will need outpatient referral for GYN for mature teratoma of left ovary, uterine fibroids, menorrhagia  5. Recommend outpatient HIDA if continued vomiting  Discharge Condition: Stable CODE STATUS: Full  Diet recommendation: Regular   Brief/Interim Summary: Julie Smithis a 45 y.o.femalewith medical history significant ofmenorrhagia, presented with increasing shortness of breath.  Patient has history of heavy menstrual for last 2 years, menstrual period usually last 7 to 10 days, with 3 days very heavy along with low abdominal cramping.She never set up her primary care so she never went to seekmedical attention. For last 41-month, patient has experienced increasing fatigue and shortness of breath on climbing stairs. She denied any new abdominal pain, no blood instoolor dark colored stool.She also admitted occasionally coughing up whitish colored sputum, and she will developed leg swelling over the last couple months. She went to see her PCP yesterday, PCP diagnosed her with the asthma and prescribed her with inhalers and blood work showed hemoglobin 2.4.   Patient received total 3 unit packed red blood cells, IV iron. Hematology consulted for anemia.  Discharge Diagnoses:  Principal Problem:   Symptomatic anemia Active Problems:   Iron deficiency anemia due to chronic blood loss   Menorrhagia   Chronic vomiting   Fibroid uterus   Ovarian teratoma, left   Fluid overload   Symptomatic anemia secondary to chronic blood loss, chronic iron deficiency related to menorrhagia -Presented with hemoglobin 2.5 --> 8.1 on day of  discharge  -Ferritin 2 -Status post 2 unit packed red blood cell on 3/24, 1 unit on 3/25 -Status post IV iron  -Follow up with Dr. Marin Olp in 3 weeks   Chronic vomiting syndrome -Unclear etiology -CT abdomen pelvis: Enlarged myomatous uterus, Left ovarian 5.0 cm mature teratoma, Evidence of third spacing of fluid with small volume ascites, mild periportal edema and small dependent bilateral pleural effusions. Nonspecific prominent diffuse gallbladder wall thickening, which could be noninflammatory or inflammatory. No radiopaque cholelithiasis. No biliary ductal dilatation. Right upper quadrant ultrasound correlation could be obtained if there is clinical concern for cholecystitis. -RUQ [place]: Diffuse gallbladder wall thickening with negative sonographic Murphy's sign. HIDA scan may be helpful to assess for gallbladder function. -HIDA unable to complete over the weekend as inpatient  -Improved, no vomiting since admission and no abdominal pain   Fibroid and ovarian mature teratoma  -Pelvis US revealed enlarged fibroid uterus as well as 5.8cm complex left ovarian cyst.  Recommended repeat ultrasound in 3 to 6 months to follow-up on hemorrhagic cyst -CT abdomen pelvis: Enlarged myomatous uterus, Left ovarian 5.0 cm mature teratoma -Need outpatient GYN follow up   Fluid overload -Received IV Lasix. Improved fluid status    Discharge Instructions  Discharge Instructions    Ambulatory referral to Gynecology   Complete by: As directed    Call MD for:  difficulty breathing, headache or visual disturbances   Complete by: As directed    Call MD for:  extreme fatigue   Complete by: As directed    Call MD for:  persistant dizziness or light-headedness   Complete by: As directed    Call MD for:  persistant nausea and vomiting  Complete by: As directed    Call MD for:  severe uncontrolled pain   Complete by: As directed    Call MD for:  temperature >100.4   Complete by: As directed    Diet  general   Complete by: As directed    Discharge instructions   Complete by: As directed    You were cared for by a hospitalist during your hospital stay. If you have any questions about your discharge medications or the care you received while you were in the hospital after you are discharged, you can call the unit and ask to speak with the hospitalist on call if the hospitalist that took care of you is not available. Once you are discharged, your primary care physician will handle any further medical issues. Please note that NO REFILLS for any discharge medications will be authorized once you are discharged, as it is imperative that you return to your primary care physician (or establish a relationship with a primary care physician if you do not have one) for your aftercare needs so that they can reassess your need for medications and monitor your lab values.   Increase activity slowly   Complete by: As directed      Allergies as of 08/19/2020   No Known Allergies     Medication List    STOP taking these medications   albuterol 108 (90 Base) MCG/ACT inhaler Commonly known as: VENTOLIN HFA     TAKE these medications   ferrous gluconate 324 MG tablet Commonly known as: FERGON Take 1 tablet (324 mg total) by mouth daily with breakfast. Start taking on: August 21, 2583   folic acid 1 MG tablet Commonly known as: FOLVITE Take 2 tablets (2 mg total) by mouth daily. Start taking on: August 20, 2020   ondansetron 4 MG tablet Commonly known as: ZOFRAN Take 4 mg by mouth every 8 (eight) hours as needed for nausea/vomiting.   Trelegy Ellipta 100-62.5-25 MCG/INH Aepb Generic drug: Fluticasone-Umeclidin-Vilant Inhale 1 puff into the lungs daily.       Follow-up Information    Lucianne Lei, MD Follow up.   Specialty: Family Medicine Why: Will need repeat blood work to check hemoglobin, will need GYN referral, outpatient HIDA scan if continued vomiting symptoms  Contact information: [address] [place] [address] [phone]        Volanda Napoleon, MD. Schedule an appointment as soon as possible for a visit in 3 week(s).   Specialty: Oncology Contact information: [address] [place] [address] [phone]              No Known Allergies  Consultations:  Hematology    Procedures/Studies: US Pelvis Complete  Result Date: 08/17/2020 CLINICAL DATA:  Vaginal bleeding EXAM: TRANSABDOMINAL ULTRASOUND OF PELVIS DOPPLER ULTRASOUND OF OVARIES TECHNIQUE: Transabdominal ultrasound examination of the pelvis was performed including evaluation of the uterus, ovaries, adnexal regions, and pelvic cul-de-sac. Color and duplex Doppler ultrasound was utilized to evaluate blood flow to the ovaries. COMPARISON:  None. FINDINGS: Uterus Measurements: 14 x 7.6 x 9.8 cm = volume: 541 mL. Numerous fibroids measuring up to 5.7 cm in the lower uterine segment. Endometrium Thickness: Difficult to visualize due to the numerous fibroids. Right ovary Measurements: 2.9 x 2.0 x 3.5 cm = volume: 10.2 mL. Normal appearance/no adnexal mass. Left ovary Measurements: 7.5 x 4.1 x 6.1 cm = volume: 98 mL. 5.8 x 5.1 x 3.9 cm complex cyst with  internal echoes. Pulsed Doppler evaluation demonstrates normal low-resistance arterial and venous waveforms in both ovaries. Other: No free fluid. IMPRESSION: Enlarged fibroid uterus. 5.8 cm complex cyst in the left over, likely hemorrhagic cyst. Recommend follow-up US in 3-6 months. Note: This recommendation does not apply to premenarchal patients or to those with increased risk (genetic, family history, elevated tumor markers or other high-risk factors) of ovarian cancer. Reference: Radiology 2019 Nov; 293(2):359-371. Electronically Signed   By: Rolm Baptise M.D.   On: 08/17/2020 19:31   CT ABDOMEN PELVIS W CONTRAST  Result Date: 08/18/2020 CLINICAL DATA:  Nausea and vomiting.  Anemia.  Inpatient. EXAM: CT ABDOMEN AND PELVIS WITH  CONTRAST TECHNIQUE: Multidetector CT imaging of the abdomen and pelvis was performed using the standard protocol following bolus administration of intravenous contrast. CONTRAST:  145mL OMNIPAQUE IOHEXOL 300 MG/ML  SOLN COMPARISON:  None. FINDINGS: Lower chest: Small dependent bilateral pleural effusions with passive mild dependent bibasilar atelectasis. Hepatobiliary: Normal liver size. Subcentimeter hypodense inferior segment 5 right liver lesion, too small to characterize, requiring no follow-up unless the patient has risk factors for liver malignancy. No additional liver lesions. Prominent diffuse gallbladder wall thickening. No radiopaque cholelithiasis. No biliary ductal dilatation. CBD diameter 3 mm. Mild diffuse periportal edema. Pancreas: Normal, with no mass or duct dilation. Spleen: Normal size. No mass. Adrenals/Urinary Tract: Normal adrenals. No renal masses. No hydronephrosis. Normal bladder. Stomach/Bowel: Normal non-distended stomach. Normal caliber small bowel with no small bowel wall thickening. Normal appendix. Oral contrast transits to the rectum. Normal large bowel with no diverticulosis, large bowel wall thickening or pericolonic fat stranding. Vascular/Lymphatic: Normal caliber abdominal aorta. Patent portal, splenic, hepatic and renal veins. No pathologically enlarged lymph nodes in the abdomen or pelvis. Reproductive: Enlarged myomatous uterus measures 13.1 x 8.9 x 11.6 cm with dominant coarsely calcified degenerated 5.6 cm anterior upper left uterine fibroid. Left ovarian 5.0 x 4.6 cm mass with mixed fluid, fat and calcific density (series 3/image 52), compatible with a mature teratoma. No right adnexal masses. Other: No pneumoperitoneum. Small volume ascites. No focal fluid collections. Musculoskeletal: No aggressive appearing focal osseous lesions. IMPRESSION: 1. Enlarged myomatous uterus. 2. 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 3. Evidence of third spacing of fluid with small volume  ascites, mild periportal edema and small dependent bilateral pleural effusions. 4. Nonspecific prominent diffuse gallbladder wall thickening, which could be noninflammatory or inflammatory. No radiopaque cholelithiasis. No biliary ductal dilatation. Right upper quadrant ultrasound correlation could be obtained if there is clinical concern for cholecystitis. Electronically Signed   By: Ilona Sorrel M.D.   On: 08/18/2020 13:02   US PELVIC DOPPLER (TORSION R/O OR MASS ARTERIAL FLOW)  Result Date: 08/17/2020 CLINICAL DATA:  Vaginal bleeding EXAM: TRANSABDOMINAL ULTRASOUND OF PELVIS DOPPLER ULTRASOUND OF OVARIES TECHNIQUE: Transabdominal ultrasound examination of the pelvis was performed including evaluation of the uterus, ovaries, adnexal regions, and pelvic cul-de-sac. Color and duplex Doppler ultrasound was utilized to evaluate blood flow to the ovaries. COMPARISON:  None. FINDINGS: Uterus Measurements: 14 x 7.6 x 9.8 cm = volume: 541 mL. Numerous fibroids measuring up to 5.7 cm in the lower uterine segment. Endometrium Thickness: Difficult to visualize due to the numerous fibroids. Right ovary Measurements: 2.9 x 2.0 x 3.5 cm = volume: 10.2 mL. Normal appearance/no adnexal mass. Left ovary Measurements: 7.5 x 4.1 x 6.1 cm = volume: 98 mL. 5.8 x 5.1 x 3.9 cm complex cyst with internal echoes. Pulsed Doppler evaluation demonstrates normal low-resistance arterial and venous waveforms in both ovaries.  Other: No free fluid. IMPRESSION: Enlarged fibroid uterus. 5.8 cm complex cyst in the left over, likely hemorrhagic cyst. Recommend follow-up US in 3-6 months. Note: This recommendation does not apply to premenarchal patients or to those with increased risk (genetic, family history, elevated tumor markers or other high-risk factors) of ovarian cancer. Reference: Radiology 2019 Nov; 293(2):359-371. Electronically Signed   By: Rolm Baptise M.D.   On: 08/17/2020 19:31   DG Chest Port 1 View  Result Date:  08/18/2020 CLINICAL DATA:  CHF EXAM: PORTABLE CHEST 1 VIEW COMPARISON:  Yesterday FINDINGS: Cardiomegaly. Congested appearance of vessels with interstitial prominence and small pleural effusions. No pneumothorax. Artifact from EKG leads. IMPRESSION: CHF pattern. Electronically Signed   By: Monte Fantasia M.D.   On: 08/18/2020 07:18   DG Chest Port 1 View  Result Date: 08/17/2020 CLINICAL DATA:  Peripheral swelling and decreased hemoglobin EXAM: PORTABLE CHEST 1 VIEW COMPARISON:  None. FINDINGS: Cardiac shadow is mildly enlarged. Lungs are well aerated bilaterally with small pleural effusions bilaterally. No focal infiltrate is seen. No bony abnormality is noted. IMPRESSION: Small effusions bilaterally. Electronically Signed   By: Inez Catalina M.D.   On: 08/17/2020 16:13   US Abdomen Limited RUQ (LIVER/GB)  Result Date: 08/18/2020 CLINICAL DATA:  Right upper quadrant pain with nausea and vomiting EXAM: ULTRASOUND ABDOMEN LIMITED RIGHT UPPER QUADRANT COMPARISON:  CT from earlier in the same day. FINDINGS: Gallbladder: Gallbladder is well distended with diffuse wall thickening up to 8 mm. Negative sonographic Murphy's sign is elicited however. Common bile duct: Diameter: 2 mm Liver: No focal lesion identified. Previously seen lesion in the right lobe of the liver is not well appreciated on this exam. Within normal limits in parenchymal echogenicity. Portal vein is patent on color Doppler imaging with normal direction of blood flow towards the liver. Other: Right-sided pleural effusion is noted. Mild ascites is noted. IMPRESSION: Diffuse gallbladder wall thickening with negative sonographic Murphy's sign. HIDA scan may be helpful to assess for gallbladder function. Right-sided pleural effusion. Mild ascites is noted. The gallbladder changes may be partially related to ascites. Electronically Signed   By: Inez Catalina M.D.   On: 08/18/2020 21:09       Discharge Exam: Vitals:   08/18/20 1956 08/19/20 0302   BP: 126/67 118/78  Pulse: (!) 54 (!) 55  Resp: 17 15  Temp: 98.4 F (36.9 C) 98.1 F (36.7 C)  SpO2: 100% 100%    General: Pt is alert, awake, not in acute distress Cardiovascular: RRR, S1/S2 +, no edema Respiratory: CTA bilaterally, no wheezing, no rhonchi, no respiratory distress, no conversational dyspnea  Abdominal: Soft, NT, ND, bowel sounds + Extremities: no edema, no cyanosis Psych: Normal mood and affect, stable judgement and insight     The results of significant diagnostics from this hospitalization (including imaging, microbiology, ancillary and laboratory) are listed below for reference.     Microbiology: Recent Results (from the past 240 hour(s))  Resp Panel by RT-PCR (Flu A&B, Covid) Nasopharyngeal Swab     Status: None   Collection Time: 08/17/20  3:11 PM   Specimen: Nasopharyngeal Swab; Nasopharyngeal(NP) swabs in vial transport medium  Result Value Ref Range Status   SARS Coronavirus 2 by RT PCR NEGATIVE NEGATIVE Final    Comment: (NOTE) SARS-CoV-2 target nucleic acids are NOT DETECTED.  The SARS-CoV-2 RNA is generally detectable in upper respiratory specimens during the acute phase of infection. The lowest concentration of SARS-CoV-2 viral copies this assay can detect is 138 copies/mL.  A negative result does not preclude SARS-Cov-2 infection and should not be used as the sole basis for treatment or other patient management decisions. A negative result may occur with  improper specimen collection/handling, submission of specimen other than nasopharyngeal swab, presence of viral mutation(s) within the areas targeted by this assay, and inadequate number of viral copies(<138 copies/mL). A negative result must be combined with clinical observations, patient history, and epidemiological information. The expected result is Negative.  Fact Sheet for Patients:  EntrepreneurPulse.com.au  Fact Sheet for Healthcare Providers:   IncredibleEmployment.be  This test is no t yet approved or cleared by the [place] FDA and  has been authorized for detection and/or diagnosis of SARS-CoV-2 by FDA under an Emergency Use Authorization (EUA). This EUA will remain  in effect (meaning this test can be used) for the duration of the COVID-19 declaration under Section 564(b)(1) of the Act, 21 U.S.C.section 360bbb-3(b)(1), unless the authorization is terminated  or revoked sooner.       Influenza A by PCR NEGATIVE NEGATIVE Final   Influenza B by PCR NEGATIVE NEGATIVE Final    Comment: (NOTE) The Xpert Xpress SARS-CoV-2/FLU/RSV plus assay is intended as an aid in the diagnosis of influenza from Nasopharyngeal swab specimens and should not be used as a sole basis for treatment. Nasal washings and aspirates are unacceptable for Xpert Xpress SARS-CoV-2/FLU/RSV testing.  Fact Sheet for Patients: EntrepreneurPulse.com.au  Fact Sheet for Healthcare Providers: IncredibleEmployment.be  This test is not yet approved or cleared by the [place] FDA and has been authorized for detection and/or diagnosis of SARS-CoV-2 by FDA under an Emergency Use Authorization (EUA). This EUA will remain in effect (meaning this test can be used) for the duration of the COVID-19 declaration under Section 564(b)(1) of the Act, 21 U.S.C. section 360bbb-3(b)(1), unless the authorization is terminated or revoked.  Performed at [facility], [place] [address], [place], [place] [address]      Labs: BNP (last 3 results) Recent Labs    08/17/20 1522  BNP 086.7*   Basic Metabolic Panel: Recent Labs  Lab 08/17/20 1511 08/18/20 0403  NA 138 138  K 3.0* 4.1  CL 106 108  CO2 24 22  GLUCOSE 112* 85  BUN <5* <5*  CREATININE 0.63 0.66  CALCIUM 8.4* 8.4*  MG  --  2.2  PHOS  --  3.7   Liver Function Tests: Recent Labs  Lab 08/17/20 1511  AST 19  ALT 29  ALKPHOS 79   BILITOT 0.5  PROT 7.3  ALBUMIN 3.4*   No results for input(s): LIPASE, AMYLASE in the last 168 hours. No results for input(s): AMMONIA in the last 168 hours. CBC: Recent Labs  Lab 08/17/20 1511 08/18/20 0403 08/18/20 1915 08/19/20 0322  WBC 10.4 8.5  --  12.1*  NEUTROABS 7.8*  --   --  9.2*  HGB 2.5* 6.2* 8.5* 8.1*  HCT 9.8* 18.5* 26.0* 25.0*  MCV 65.8* 76.1*  --  79.1*  PLT 1,235* 886*  --  805*   Cardiac Enzymes: No results for input(s): CKTOTAL, CKMB, CKMBINDEX, TROPONINI in the last 168 hours. BNP: Invalid input(s): POCBNP CBG: No results for input(s): GLUCAP in the last 168 hours. D-Dimer No results for input(s): DDIMER in the last 72 hours. Hgb A1c No results for input(s): HGBA1C in the last 72 hours. Lipid Profile No results for input(s): CHOL, HDL, LDLCALC, TRIG, CHOLHDL, LDLDIRECT in the last 72 hours. Thyroid function studies Recent Labs    08/18/20  0403  TSH 1.863   Anemia work up Recent Labs    08/17/20 1523 08/19/20 0322  VITAMINB12 633  --   FOLATE 25.7  --   FERRITIN 2*  --   TIBC 475*  --   IRON 7*  --   RETICCTPCT 0.9 1.2   Urinalysis    Component Value Date/Time   COLORURINE YELLOW 08/18/2020 [place] 08/18/2020 0415   LABSPEC 1.008 08/18/2020 0415   PHURINE 7.0 08/18/2020 0415   GLUCOSEU NEGATIVE 08/18/2020 0415   HGBUR NEGATIVE 08/18/2020 0415   BILIRUBINUR NEGATIVE 08/18/2020 0415   KETONESUR 5 (A) 08/18/2020 0415   PROTEINUR NEGATIVE 08/18/2020 0415   NITRITE NEGATIVE 08/18/2020 0415   LEUKOCYTESUR NEGATIVE 08/18/2020 0415   Sepsis Labs Invalid input(s): PROCALCITONIN,  WBC,  LACTICIDVEN Microbiology Recent Results (from the past 240 hour(s))  Resp Panel by RT-PCR (Flu A&B, Covid) Nasopharyngeal Swab     Status: None   Collection Time: 08/17/20  3:11 PM   Specimen: Nasopharyngeal Swab; Nasopharyngeal(NP) swabs in vial transport medium  Result Value Ref Range Status   SARS Coronavirus 2 by RT PCR NEGATIVE  NEGATIVE Final    Comment: (NOTE) SARS-CoV-2 target nucleic acids are NOT DETECTED.  The SARS-CoV-2 RNA is generally detectable in upper respiratory specimens during the acute phase of infection. The lowest concentration of SARS-CoV-2 viral copies this assay can detect is 138 copies/mL. A negative result does not preclude SARS-Cov-2 infection and should not be used as the sole basis for treatment or other patient management decisions. A negative result may occur with  improper specimen collection/handling, submission of specimen other than nasopharyngeal swab, presence of viral mutation(s) within the areas targeted by this assay, and inadequate number of viral copies(<138 copies/mL). A negative result must be combined with clinical observations, patient history, and epidemiological information. The expected result is Negative.  Fact Sheet for Patients:  EntrepreneurPulse.com.au  Fact Sheet for Healthcare Providers:  IncredibleEmployment.be  This test is no t yet approved or cleared by the [place] FDA and  has been authorized for detection and/or diagnosis of SARS-CoV-2 by FDA under an Emergency Use Authorization (EUA). This EUA will remain  in effect (meaning this test can be used) for the duration of the COVID-19 declaration under Section 564(b)(1) of the Act, 21 U.S.C.section 360bbb-3(b)(1), unless the authorization is terminated  or revoked sooner.       Influenza A by PCR NEGATIVE NEGATIVE Final   Influenza B by PCR NEGATIVE NEGATIVE Final    Comment: (NOTE) The Xpert Xpress SARS-CoV-2/FLU/RSV plus assay is intended as an aid in the diagnosis of influenza from Nasopharyngeal swab specimens and should not be used as a sole basis for treatment. Nasal washings and aspirates are unacceptable for Xpert Xpress SARS-CoV-2/FLU/RSV testing.  Fact Sheet for Patients: EntrepreneurPulse.com.au  Fact Sheet for Healthcare  Providers: IncredibleEmployment.be  This test is not yet approved or cleared by the [place] FDA and has been authorized for detection and/or diagnosis of SARS-CoV-2 by FDA under an Emergency Use Authorization (EUA). This EUA will remain in effect (meaning this test can be used) for the duration of the COVID-19 declaration under Section 564(b)(1) of the Act, 21 U.S.C. section 360bbb-3(b)(1), unless the authorization is terminated or revoked.  Performed at [facility], [place] [address], [place], [place] [address]      Patient was seen and examined on the day of discharge and was found to be in stable condition. Time coordinating discharge: 66  minutes including assessment and coordination of care, as well as examination of the patient.   SIGNED:  Dessa Phi, DO [facility] 08/19/2020, 1:11 PM

## 2020-08-21 LAB — HGB FRACTIONATION CASCADE
Hgb A2: 2.3 % (ref 1.8–3.2)
Hgb A: 97.7 % (ref 96.4–98.8)
Hgb F: 0 % (ref 0.0–2.0)
Hgb S: 0 %

## 2020-08-22 ENCOUNTER — Telehealth: Payer: Self-pay

## 2020-08-22 NOTE — Telephone Encounter (Signed)
Julie Larson referring for Teratoma of left ovary. Called and left voicemail for patient to call back to be scheduled.

## 2020-08-23 NOTE — Telephone Encounter (Signed)
Called and left voicemail for patient to call back to be scheduled. 

## 2020-08-24 NOTE — Telephone Encounter (Signed)
Called and left voicemail for patient to call back to be scheduled. 

## 2020-08-25 NOTE — Telephone Encounter (Signed)
Called and left voicemail for patient to call back to be scheduled. 

## 2020-09-13 ENCOUNTER — Telehealth: Payer: Self-pay | Admitting: *Deleted

## 2020-09-13 NOTE — Telephone Encounter (Signed)
Per Staff message Roselyn Reef [phone] - called patient - unable to lvm - mailbox is full. Mailed welcome packet with calendar

## 2020-09-15 ENCOUNTER — Encounter: Payer: BC Managed Care – PPO | Admitting: Obstetrics and Gynecology

## 2020-09-21 ENCOUNTER — Inpatient Hospital Stay: Payer: BC Managed Care – PPO | Admitting: Hematology & Oncology

## 2020-09-21 ENCOUNTER — Inpatient Hospital Stay: Payer: BC Managed Care – PPO | Attending: Family Medicine

## 2020-09-25 ENCOUNTER — Encounter: Payer: BC Managed Care – PPO | Admitting: Obstetrics and Gynecology

## 2020-10-11 ENCOUNTER — Encounter: Payer: BC Managed Care – PPO | Admitting: Obstetrics and Gynecology

## 2020-10-27 ENCOUNTER — Ambulatory Visit (INDEPENDENT_AMBULATORY_CARE_PROVIDER_SITE_OTHER): Payer: BC Managed Care – PPO | Admitting: Obstetrics and Gynecology

## 2020-10-27 ENCOUNTER — Other Ambulatory Visit: Payer: Self-pay

## 2020-10-27 ENCOUNTER — Encounter: Payer: Self-pay | Admitting: Obstetrics and Gynecology

## 2020-10-27 VITALS — BP 118/70 | Ht 60.0 in | Wt 120.0 lb

## 2020-10-27 DIAGNOSIS — D251 Intramural leiomyoma of uterus: Secondary | ICD-10-CM

## 2020-10-27 DIAGNOSIS — D271 Benign neoplasm of left ovary: Secondary | ICD-10-CM

## 2020-10-27 NOTE — Progress Notes (Signed)
Patient ID: Julie Larson, female   DOB: 01/30/1976, 45 y.o.   MRN: [id number]  Reason for Consult: Gynecologic Exam   Referred by Lucianne Lei, MD  Subjective:     HPI:  Julie Larson is a 45 y.o. female.  She presents today for follow-up from the hospital regarding chronic blood loss anemia.  The patient was recently seen in the hospital and treated for profound anemia with a hemoglobin of 2.5.  She was treated with blood transfusions and her hemoglobin improved to 8.5 prior to discharge from the hospital.  She reports that she is having extreme symptoms from anemia including ringing in the ears chest pain leg swelling and difficulty clearing breathing.  She reports that her physician thought that she had heart failure or renal failure.  Now that she has had her anemia corrected and is receiving iron and B12 injections she is feeling significantly better.  She reports that she has not seen an OB/GYN in many years.  She feels that she can palpate her enlarged uterus on her abdomen however she never thought her fibroids were as problematic as what her friends.  She generally has bleeding every 24 days for about 6 days.  She does pass large clots and have to frequently take.  She also endorses gushing and flooding of blood.  However she does not feel like her bleeding has limited her activities.  She denies pelvic pain or pressure.   Gynecological History  Patient's last menstrual period was 10/04/2020. Menarche: 7  She reports passage of large clots She reports sensations of gushing or flooding of blood. She denies accidents where she bleeds through her clothing. She reports that she changes a saturated pad or tampon more frequently than every hour.  She denies that pain from her periods limits her activities.  History of fibroids, polyps, or ovarian cysts? : yes (fibroids and left dermoid) History of PCOS? no Hstory of Endometriosis? n History of abnormal pap smears? no Have you had any  sexually transmitted infections in the past? no  Last Pap 2012 per patient  She identifies as a female. She is not sexually active.  She currently uses abstinence for contraception.   Obstetrical History G0P0  History reviewed. No pertinent past medical history. Family History  Problem Relation Age of Onset  . Diabetes Father   . Hypertension Father    History reviewed. No pertinent surgical history.  Short Social History:  Social History   Tobacco Use  . Smoking status: Not on file  . Smokeless tobacco: Not on file  Substance Use Topics  . Alcohol use: Not on file    No Known Allergies  Current Outpatient Medications  Medication Sig Dispense Refill  . ferrous gluconate (FERGON) 324 MG tablet Take 1 tablet (324 mg total) by mouth daily with breakfast. 30 tablet 2  . folic acid (FOLVITE) 1 MG tablet Take 2 tablets (2 mg total) by mouth daily. 60 tablet 2  . Fluticasone-Umeclidin-Vilant (TRELEGY ELLIPTA) 100-62.5-25 MCG/INH AEPB Inhale 1 puff into the lungs daily. (Patient not taking: Reported on 10/27/2020)    . ondansetron (ZOFRAN) 4 MG tablet Take 4 mg by mouth every 8 (eight) hours as needed for nausea/vomiting. (Patient not taking: No sig reported)     No current facility-administered medications for this visit.    Review of Systems  Constitutional: Negative for chills, fatigue, fever and unexpected weight change.  HENT: Negative for trouble swallowing.  Eyes: Negative for loss of vision.  Respiratory: Negative  for cough, shortness of breath and wheezing.  Cardiovascular: Negative for chest pain, leg swelling, palpitations and syncope.  GI: Negative for abdominal pain, blood in stool, diarrhea, nausea and vomiting.  GU: Negative for difficulty urinating, dysuria, frequency and hematuria.  Musculoskeletal: Negative for back pain, leg pain and joint pain.  Skin: Negative for rash.  Neurological: Negative for dizziness, headaches, light-headedness, numbness and  seizures.  Psychiatric: Negative for behavioral problem, confusion, depressed mood and sleep disturbance.        Objective:  Objective   Vitals:   10/27/20 1430  BP: 118/70  Weight: 120 lb (54.4 kg)  Height: 5' (1.524 m)   Body mass index is 23.44 kg/m.  Physical Exam Vitals and nursing note reviewed. Exam conducted with a chaperone present.  Constitutional:      Appearance: Normal appearance.  HENT:     Head: Normocephalic and atraumatic.  Eyes:     Extraocular Movements: Extraocular movements intact.     Pupils: Pupils are equal, round, and reactive to light.  Cardiovascular:     Rate and Rhythm: Normal rate and regular rhythm.  Pulmonary:     Effort: Pulmonary effort is normal.     Breath sounds: Normal breath sounds.  Abdominal:     General: Abdomen is flat.     Palpations: Abdomen is soft.  Musculoskeletal:     Cervical back: Normal range of motion.  Skin:    General: Skin is warm and dry.  Neurological:     General: No focal deficit present.     Mental Status: She is alert and oriented to person, place, and time.  Psychiatric:        Behavior: Behavior normal.        Thought Content: Thought content normal.        Judgment: Judgment normal.     Assessment/Plan:     45 y.o. 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  We discussed options for management of uterine fibroids.  We discussed that this can include hormonal medications such as an oral contraceptive pill, progesterone only pill, Depo-Provera, Nexplanon,  progesterone IUD.  We discussed surgical options including hysteroscopy with myomectomy for small submucosal fibroids, abdominal or laparoscopic myomectomy, uterine fibroid embolization, and hysterectomy.  We discussed the pros and cons of these different procedures and medical management options in detail.  She reports that she would like to conceive in the future.  She is not  currently with a partner. We discussed that spontaneous pregnancy at 70 is exceedingly rare without fertility assistance and often donor oocytes are used for patients older than 75.  Given the patient's profound anemia secondary to chronic blood loss.  Encouraged the patient to select an option to help minimize her bleeding if she does desire to continue to pursue pregnancy in the future.   I recommended surgical removal of the left ovarian dermoid. Discussed risks of torsion given the size of 5 cm.   She will continue to consider the options that we discussed and follow-up with me when she has a selected a management plan which is best for her.  She will need a pap smear with her next visit.   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   More than 45 minutes were  spent face to face with the patient in the room, reviewing the medical record, labs and images, and coordinating care for the patient. The plan of management was discussed in detail and counseling was provided.      Adrian Prows MD [facility], [facility] 10/27/2020 3:12 PM

## 2020-10-27 NOTE — Patient Instructions (Signed)
Uterine Fibroids  Uterine fibroids, also called leiomyomas, are noncancerous (benign) tumors that can grow in the uterus. They can cause heavy menstrual bleeding and pain. Fibroids may also grow in the fallopian tubes, cervix, or tissues (ligaments) near the uterus. You may have one or many fibroids. Fibroids vary in size, weight, and where they grow in the uterus. Some can become quite large. Most fibroids do not require medical treatment. What are the causes? The cause of this condition is not known. What increases the risk? You are more likely to develop this condition if you:  Are in your 30s or 40s and have not gone through menopause.  Have a family history of this condition.  Are of African American descent.  Started your menstrual period at age 55 or younger.  Have never given birth.  Are overweight or obese. What are the signs or symptoms? Many women do not have any symptoms. Symptoms of this condition may include:  Heavy menstrual bleeding.  Bleeding between menstrual periods.  Pain and pressure in the pelvic area, between your hip bones.  Pain during sex.  Bladder problems, such as needing to urinate right away or more often than usual.  Inability to have children (infertility).  Failure to carry pregnancy to term (miscarriage). How is this diagnosed? This condition may be diagnosed based on:  Your symptoms and medical history.  A physical exam.  A pelvic exam that includes feeling for any tumors.  Imaging tests, such as ultrasound or MRI. How is this treated? Treatment for this condition may include follow-up visits with your health care provider to monitor your fibroids for any changes. Other treatment may include:  Medicines, such as: ? Medicines to relieve pain, including aspirin and NSAIDs, such as ibuprofen or naproxen. ? Hormone therapy. Treatment may be given as a pill or an injection, or it may be inserted into the uterus using an intrauterine  device (IUD).  Surgery that would do one of the following: ? Remove the fibroids (myomectomy). This may be recommended if fibroids affect your fertility and you want to become pregnant. ? Remove the uterus (hysterectomy). ? Block the blood supply to the fibroids (uterine artery embolization). This can cause them to shrink and die. Follow these instructions at home: Medicines  Take over-the-counter and prescription medicines only as told by your health care provider.  Ask your health care provider if you should take iron pills or eat more iron-rich foods, such as dark green, leafy vegetables. Heavy menstrual bleeding can cause low iron levels. Managing pain If directed, apply heat to your back or abdomen to reduce pain. Use the heat source that your health care provider recommends, such as a moist heat pack or a heating pad. To apply heat:  Place a towel between your skin and the heat source.  Leave the heat on for 20-30 minutes.  Remove the heat if your skin turns bright red. This is especially important if you are unable to feel pain, heat, or cold. You may have a greater risk of getting burned.   General instructions  Pay close attention to your menstrual cycle. Tell your health care provider about any changes, such as: ? Heavier bleeding that requires you to change your pads or tampons more than usual. ? A change in the number of days that your menstrual period lasts. ? A change in symptoms that come with your menstrual period, such as back pain or cramps in your abdomen.  Keep all follow-up visits. This is  important, especially if your fibroids need to be monitored for any changes. Contact a health care provider if you:  Have pelvic pain, back pain, or cramps in your abdomen that do not get better with medicine or heat.  Develop new bleeding between menstrual periods.  Have increased bleeding during or between menstrual periods.  Feel more tired or weak than usual.  Feel  light-headed. Get help right away if you:  Faint.  Have pelvic pain that suddenly gets worse.  Have severe vaginal bleeding that soaks a tampon or pad in 30 minutes or less. Summary  Uterine fibroids are noncancerous (benign) tumors that can develop in the uterus.  The exact cause of this condition is not known.  Most fibroids do not require medical treatment unless they affect your ability to have children (fertility).  Contact a health care provider if you have pelvic pain, back pain, or cramps in your abdomen that do not get better with medicines.  Get help right away if you faint, have pelvic pain that suddenly gets worse, or have severe vaginal bleeding. This information is not intended to replace advice given to you by your health care provider. Make sure you discuss any questions you have with your health care provider. Document Revised: 12/14/2019 Document Reviewed: 12/14/2019 Elsevier Patient Education  [place].

## 2020-11-10 ENCOUNTER — Telehealth: Payer: Self-pay

## 2020-11-10 ENCOUNTER — Ambulatory Visit (INDEPENDENT_AMBULATORY_CARE_PROVIDER_SITE_OTHER): Payer: BC Managed Care – PPO | Admitting: Obstetrics and Gynecology

## 2020-11-10 ENCOUNTER — Encounter: Payer: Self-pay | Admitting: Obstetrics and Gynecology

## 2020-11-10 ENCOUNTER — Other Ambulatory Visit: Payer: Self-pay

## 2020-11-10 VITALS — BP 100/68 | Ht 60.0 in | Wt 121.0 lb

## 2020-11-10 DIAGNOSIS — D271 Benign neoplasm of left ovary: Secondary | ICD-10-CM

## 2020-11-10 DIAGNOSIS — D251 Intramural leiomyoma of uterus: Secondary | ICD-10-CM

## 2020-11-10 NOTE — Telephone Encounter (Signed)
Spoke with patient in clinic to schedule robot assisted left ovarian cystectomy w Gilman Schmidt  DOS 7/7  H&P 6/28 @ 3:50   Pre-admit phone call appointment to be requested - date and time will be included on H&P paper work. Explained that this appointment has a call window. Based on the time scheduled will indicate if the call will be received within a 4 hour window before 1:00 or after.  Advised that pt may also receive calls from the hospital pharmacy and pre-service center.  Confirmed pt has [employer] as Chartered certified accountant. No secondary insurance.

## 2020-11-10 NOTE — H&P (View-Only) (Signed)
Patient ID: Julie Larson, female   DOB: 10-04-1975, 45 y.o.   MRN: [id number]  Reason for Consult: Follow-up   Referred by Lucianne Lei, MD  Subjective:     HPI:  Julie Larson is a 45 y.o. female. She is following up today regarding ovarian dermoid cyst and uterine fibroids. She has considered her management options. For her dermoid ovarian cyst she would like to plan ovarian cystectomy this Summer. 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   For her uterine fibroids she would like to have a uterine fibroid embolization. She understands that after having a uterine embolization pregnancy is not advisable. She also understands that about 50% of women after embolization may need a hysterectomy because of continue pain or bleeding.   Gynecological History  Patient's last menstrual period was 10/30/2020.  History reviewed. No pertinent past medical history. Family History  Problem Relation Age of Onset   Diabetes Father    Hypertension Father    History reviewed. No pertinent surgical history.  Short Social History:  Social History   Tobacco Use   Smoking status: Not on file   Smokeless tobacco: Not on file  Substance Use Topics   Alcohol use: Not on file    No Known Allergies  Current Outpatient Medications  Medication Sig Dispense Refill   ferrous gluconate (FERGON) 324 MG tablet Take 1 tablet (324 mg total) by mouth daily with breakfast. 30 tablet 2   folic acid (FOLVITE) 1 MG tablet Take 2 tablets (2 mg total) by mouth daily. 60 tablet 2   Fluticasone-Umeclidin-Vilant (TRELEGY ELLIPTA) 100-62.5-25 MCG/INH AEPB Inhale 1 puff into the lungs daily. (Patient not taking: Reported on 10/27/2020)     ondansetron (ZOFRAN) 4 MG tablet Take 4 mg by mouth every 8 (eight) hours as needed for nausea/vomiting. (Patient not taking: No sig reported)     No current facility-administered medications for this visit.    Review of  Systems  Constitutional: Negative for chills, fatigue, fever and unexpected weight change.  HENT: Negative for trouble swallowing.  Eyes: Negative for loss of vision.  Respiratory: Negative for cough, shortness of breath and wheezing.  Cardiovascular: Negative for chest pain, leg swelling, palpitations and syncope.  GI: Negative for abdominal pain, blood in stool, diarrhea, nausea and vomiting.  GU: Negative for difficulty urinating, dysuria, frequency and hematuria.  Musculoskeletal: Negative for back pain, leg pain and joint pain.  Skin: Negative for rash.  Neurological: Negative for dizziness, headaches, light-headedness, numbness and seizures.  Psychiatric: Negative for behavioral problem, confusion, depressed mood and sleep disturbance.       Objective:  Objective   Vitals:   11/10/20 1436  BP: 100/68  Weight: 121 lb (54.9 kg)  Height: 5' (1.524 m)   Body mass index is 23.63 kg/m.  Physical Exam Vitals and nursing note reviewed. Exam conducted with a chaperone present.  Constitutional:      Appearance: Normal appearance.  HENT:     Head: Normocephalic and atraumatic.  Eyes:     Extraocular Movements: Extraocular movements intact.     Pupils: Pupils are equal, round, and reactive to light.  Cardiovascular:     Rate and Rhythm: Normal rate and regular rhythm.  Pulmonary:     Effort: Pulmonary effort is normal.     Breath sounds: Normal breath sounds.  Abdominal:     General: Abdomen is flat.     Palpations: Abdomen is  soft.  Musculoskeletal:     Cervical back: Normal range of motion.  Skin:    General: Skin is warm and dry.  Neurological:     General: No focal deficit present.     Mental Status: She is alert and oriented to person, place, and time.  Psychiatric:        Behavior: Behavior normal.        Thought Content: Thought content normal.        Judgment: Judgment normal.    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  Will plan for teratoma removal in July. Discussed surgical risks and typical recovery expectations.   Referral for UFE to vascular surgery made.   Pap smear needed- can collect at her next visit.   More than 20 minutes were spent face to face with the patient in the room, reviewing the medical record, labs and images, and coordinating care for the patient. The plan of management was discussed in detail and counseling was provided.    Adrian Prows MD [facility] 11/10/2020 3:17 PM

## 2020-11-10 NOTE — Progress Notes (Signed)
Patient ID: Julie Larson, female   DOB: Jul 29, 1975, 45 y.o.   MRN: [id number]  Reason for Consult: Follow-up   Referred by Lucianne Lei, MD  Subjective:     HPI:  Julie Larson is a 45 y.o. female. She is following up today regarding ovarian dermoid cyst and uterine fibroids. She has considered her management options. For her dermoid ovarian cyst she would like to plan ovarian cystectomy this Summer. 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   For her uterine fibroids she would like to have a uterine fibroid embolization. She understands that after having a uterine embolization pregnancy is not advisable. She also understands that about 50% of women after embolization may need a hysterectomy because of continue pain or bleeding.   Gynecological History  Patient's last menstrual period was 10/30/2020.  History reviewed. No pertinent past medical history. Family History  Problem Relation Age of Onset   Diabetes Father    Hypertension Father    History reviewed. No pertinent surgical history.  Short Social History:  Social History   Tobacco Use   Smoking status: Not on file   Smokeless tobacco: Not on file  Substance Use Topics   Alcohol use: Not on file    No Known Allergies  Current Outpatient Medications  Medication Sig Dispense Refill   ferrous gluconate (FERGON) 324 MG tablet Take 1 tablet (324 mg total) by mouth daily with breakfast. 30 tablet 2   folic acid (FOLVITE) 1 MG tablet Take 2 tablets (2 mg total) by mouth daily. 60 tablet 2   Fluticasone-Umeclidin-Vilant (TRELEGY ELLIPTA) 100-62.5-25 MCG/INH AEPB Inhale 1 puff into the lungs daily. (Patient not taking: Reported on 10/27/2020)     ondansetron (ZOFRAN) 4 MG tablet Take 4 mg by mouth every 8 (eight) hours as needed for nausea/vomiting. (Patient not taking: No sig reported)     No current facility-administered medications for this visit.    Review of  Systems  Constitutional: Negative for chills, fatigue, fever and unexpected weight change.  HENT: Negative for trouble swallowing.  Eyes: Negative for loss of vision.  Respiratory: Negative for cough, shortness of breath and wheezing.  Cardiovascular: Negative for chest pain, leg swelling, palpitations and syncope.  GI: Negative for abdominal pain, blood in stool, diarrhea, nausea and vomiting.  GU: Negative for difficulty urinating, dysuria, frequency and hematuria.  Musculoskeletal: Negative for back pain, leg pain and joint pain.  Skin: Negative for rash.  Neurological: Negative for dizziness, headaches, light-headedness, numbness and seizures.  Psychiatric: Negative for behavioral problem, confusion, depressed mood and sleep disturbance.       Objective:  Objective   Vitals:   11/10/20 1436  BP: 100/68  Weight: 121 lb (54.9 kg)  Height: 5' (1.524 m)   Body mass index is 23.63 kg/m.  Physical Exam Vitals and nursing note reviewed. Exam conducted with a chaperone present.  Constitutional:      Appearance: Normal appearance.  HENT:     Head: Normocephalic and atraumatic.  Eyes:     Extraocular Movements: Extraocular movements intact.     Pupils: Pupils are equal, round, and reactive to light.  Cardiovascular:     Rate and Rhythm: Normal rate and regular rhythm.  Pulmonary:     Effort: Pulmonary effort is normal.     Breath sounds: Normal breath sounds.  Abdominal:     General: Abdomen is flat.     Palpations: Abdomen is  soft.  Musculoskeletal:     Cervical back: Normal range of motion.  Skin:    General: Skin is warm and dry.  Neurological:     General: No focal deficit present.     Mental Status: She is alert and oriented to person, place, and time.  Psychiatric:        Behavior: Behavior normal.        Thought Content: Thought content normal.        Judgment: Judgment normal.    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  Will plan for teratoma removal in July. Discussed surgical risks and typical recovery expectations.   Referral for UFE to vascular surgery made.   Pap smear needed- can collect at her next visit.   More than 20 minutes were spent face to face with the patient in the room, reviewing the medical record, labs and images, and coordinating care for the patient. The plan of management was discussed in detail and counseling was provided.    Adrian Prows MD [facility] 11/10/2020 3:17 PM

## 2020-11-10 NOTE — Telephone Encounter (Signed)
-----   Message from Homero Fellers, MD sent at 11/10/2020  3:15 PM EDT ----- Surgery Booking Request Patient Full Name:  Julie Larson  MRN: [id number]  DOB: [birth year]  Surgeon: Homero Fellers, MD  Requested Surgery Date and Time: 11/30/2020 Primary Diagnosis AND Code: Teratoma left ovary Secondary Diagnosis and Code:  Surgical Procedure: Robot assisted laparoscopic left ovarian cystectomy RNFA Requested?: No L&D Notification: No Admission Status: same day surgery Length of Surgery: 100 min Special Case Needs: No H&P: Yes Phone Interview???:  Yes Interpreter: Yes Medical Clearance:  No Special Scheduling Instructions: No Any known health/anesthesia issues, diabetes, sleep apnea, latex allergy, defibrillator/pacemaker?: No Acuity: P2   (P1 highest, P2 delay may cause harm, P3 low, elective gyn, P4 lowest)

## 2020-11-13 ENCOUNTER — Encounter: Payer: Self-pay | Admitting: Obstetrics and Gynecology

## 2020-11-21 ENCOUNTER — Other Ambulatory Visit: Payer: Self-pay

## 2020-11-21 ENCOUNTER — Encounter: Payer: Self-pay | Admitting: Obstetrics and Gynecology

## 2020-11-21 ENCOUNTER — Other Ambulatory Visit (HOSPITAL_COMMUNITY)
Admission: RE | Admit: 2020-11-21 | Discharge: 2020-11-21 | Disposition: A | Discharge status: Home / Self Care | Payer: BC Managed Care – PPO | Source: Ambulatory Visit | Attending: Obstetrics and Gynecology | Admitting: Obstetrics and Gynecology

## 2020-11-21 ENCOUNTER — Ambulatory Visit (INDEPENDENT_AMBULATORY_CARE_PROVIDER_SITE_OTHER): Payer: BC Managed Care – PPO | Admitting: Obstetrics and Gynecology

## 2020-11-21 VITALS — BP 114/72 | Ht 60.0 in | Wt 120.2 lb

## 2020-11-21 DIAGNOSIS — Z124 Encounter for screening for malignant neoplasm of cervix: Secondary | ICD-10-CM | POA: Insufficient documentation

## 2020-11-21 NOTE — Progress Notes (Signed)
Patient ID: Julie Larson, female   DOB: 1975/08/19, 45 y.o.   MRN: [id number]  Reason for Consult: Gynecologic Exam   Referred by Lucianne Lei, MD  Subjective:     HPI:  Julie Larson is a 45 y.o. female she presents today for a preoperative visit.   Gynecological History  Patient's last menstrual period was 10/30/2020.  History reviewed. No pertinent past medical history. Family History  Problem Relation Age of Onset   Diabetes Father    Hypertension Father    History reviewed. No pertinent surgical history.  Short Social History:  Social History   Tobacco Use   Smoking status: Not on file   Smokeless tobacco: Not on file  Substance Use Topics   Alcohol use: Not on file    No Known Allergies  Current Outpatient Medications  Medication Sig Dispense Refill   famotidine (PEPCID) 20 MG tablet Take 20 mg by mouth every other day as needed for heartburn or indigestion.     ferrous gluconate (FERGON) 324 MG tablet Take 1 tablet (324 mg total) by mouth daily with breakfast. 30 tablet 2   folic acid (FOLVITE) 1 MG tablet Take 2 tablets (2 mg total) by mouth daily. (Patient taking differently: Take 2 mg by mouth in the morning.) 60 tablet 2   No current facility-administered medications for this visit.    Review of Systems  Constitutional: Negative for chills, fatigue, fever and unexpected weight change.  HENT: Negative for trouble swallowing.  Eyes: Negative for loss of vision.  Respiratory: Negative for cough, shortness of breath and wheezing.  Cardiovascular: Negative for chest pain, leg swelling, palpitations and syncope.  GI: Negative for abdominal pain, blood in stool, diarrhea, nausea and vomiting.  GU: Negative for difficulty urinating, dysuria, frequency and hematuria.  Musculoskeletal: Negative for back pain, leg pain and joint pain.  Skin: Negative for rash.  Neurological: Negative for dizziness, headaches, light-headedness, numbness and seizures.   Psychiatric: Negative for behavioral problem, confusion, depressed mood and sleep disturbance.       Objective:  Objective   Vitals:   11/21/20 1605  BP: 114/72  Weight: 120 lb 3.2 oz (54.5 kg)  Height: 5' (1.524 m)   Body mass index is 23.47 kg/m.  Physical Exam Vitals and nursing note reviewed. Exam conducted with a chaperone present.  Constitutional:      Appearance: Normal appearance.  HENT:     Head: Normocephalic and atraumatic.  Eyes:     Extraocular Movements: Extraocular movements intact.     Pupils: Pupils are equal, round, and reactive to light.  Cardiovascular:     Rate and Rhythm: Normal rate and regular rhythm.  Pulmonary:     Effort: Pulmonary effort is normal.     Breath sounds: Normal breath sounds.  Abdominal:     General: Abdomen is flat.     Palpations: Abdomen is soft.  Musculoskeletal:     Cervical back: Normal range of motion.  Skin:    General: Skin is warm and dry.  Neurological:     General: No focal deficit present.     Mental Status: She is alert and oriented to person, place, and time.  Psychiatric:        Behavior: Behavior normal.        Thought Content: Thought content normal.        Judgment: Judgment normal.    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 Anticipated recovery discussed.  Questions answered. She plans to follow with vascular surgery for fibroid embolization.   More than 20 minutes were spent face to face with the patient in the room, reviewing the medical record, labs and images, and coordinating care for the patient. The plan of management was discussed in detail and counseling was provided.      Adrian Prows MD [facility], [facility] 11/21/2020 4:28 PM

## 2020-11-21 NOTE — Patient Instructions (Signed)
https://www.[facility].org/womens-health/faqs/[email or web address]-cysts">  Ovarian Cystectomy Ovarian cystectomy is a procedure that is done to remove a fluid-filled sac (cyst) on an ovary. The ovaries are small organs that produce eggs in women. Various types of cysts can form on the ovaries. Most cysts are not cancerous. This procedure may be done for cysts that are large, cause symptoms, or do not go away on their own. It may also be done for a cyst that is cancerous or might becancerous. This surgery can be done using a laparoscopic technique or an open abdominal technique. The laparoscopic technique is minimally invasive and results in smaller incisions and a faster recovery. The technique used will depend on your age, the type of cyst that you have, and whether the cyst is cancerous. Thelaparoscopic technique is not used for a cancerous cyst. Tell a health care provider about: Any allergies you have. All medicines you are taking, including vitamins, herbs, eye drops, creams, and over-the-counter medicines. Any problems you or family members have had with anesthetic medicines. Any blood disorders you have. Any surgeries you have had. Any medical conditions you have. Whether you are pregnant or may be pregnant. What are the risks? Generally, this is a safe procedure. However, problems may occur, including: Excessive bleeding. Infection. Damage to nearby structures or organs. Allergic reactions to medicines. Blood clots. Inability to get pregnant (infertility). What happens before the procedure? Staying hydrated Follow instructions from your health care provider about hydration, which may include: Up to 2 hours before the procedure - you may continue to drink clear liquids, such as water, clear fruit juice, black coffee, and plain tea.  Eating and drinking restrictions Follow instructions from your health care provider about eating and drinking, which may include: 8 hours before the procedure - stop  eating heavy meals or foods, such as meat, fried foods, or fatty foods. 6 hours before the procedure - stop eating light meals or foods, such as toast or cereal. 6 hours before the procedure - stop drinking milk or drinks that contain milk. 2 hours before the procedure - stop drinking clear liquids. Medicines Ask your health care provider about: Changing or stopping your regular medicines. This is especially important if you are taking diabetes medicines or blood thinners. Taking medicines such as aspirin and ibuprofen. These medicines can thin your blood. Do not take these medicines unless your health care provider tells you to take them. Taking over-the-counter medicines, vitamins, herbs, and supplements. General instructions Do not use any products that contain nicotine or tobacco for at least 4 weeks before the procedure. These products include cigarettes, e-cigarettes, and chewing tobacco. If you need help quitting, ask your health care provider. Ask your health care provider: How your surgery site will be marked. What steps will be taken to help prevent infection. These may include: Removing hair at the surgery site. Washing skin with a germ-killing soap. Taking antibiotic medicine. You may be asked to shower with a germ-killing soap. Plan to have someone take you home from the hospital or clinic. Plan to have someone help with household activities for 1-2 weeks after the procedure. Let your health care provider know if you develop a cold or any infection before your surgery. What happens during the procedure? An IV will be inserted into one of your veins. You will be given one or more of the following: A medicine to help you relax (sedative). A medicine to make you fall asleep (general anesthetic). Small monitors will be attached to your body. They will be  used to check your heart, blood pressure, and oxygen level. A breathing tube will be placed to help you breathe during the  procedure. Your surgeon will do the surgery using either the laparoscopic technique or the open abdominal technique. Laparoscopic technique  Several small incisions will be made in your abdomen. Your abdomen will be filled with carbon dioxide gas to make it expand. This will give the surgeon more room to operate. It will also make your organs easier to see. A thin scope with a camera (laparoscope) will be put through one of the small incisions. The laparoscope will send a picture to a monitor in the operating room to help the surgeon see inside your body. Hollow tubes will be put through the other small incisions in your abdomen. The tools needed for the procedure will be put through these tubes. The ovary with the cyst will be identified, and the cyst will be removed. The tools will then be removed, and the incisions will be closed with stitches or skin glue. Bandages (dressings) may be applied to the incisions.  Open abdominal technique A single, large incision will be made along your bikini line or in the middle of your lower abdomen. The ovary with the cyst will be identified, and the cyst will be removed. The incision will then be closed with stitches or staples. Bandages (dressings) may be applied to the incisions. The procedure may vary among health care providers and hospitals. What happens after the procedure? Your blood pressure, heart rate, breathing rate, and blood oxygen level will be monitored until you leave the hospital or clinic. Your IV will be removed after you are able to eat and drink well. You may be given medicine for pain or to help you sleep. You may be given an antibiotic medicine. Do not drive for 24 hours if you were given a sedative during the procedure. The cyst that was removed will be sent to the lab for testing. If the cyst has cancer cells, both ovaries may need to be removed during a different surgery. It is up to you to get the results of your procedure.  Ask your health care provider, or the department that is doing the procedure, when your results will be ready. Summary Ovarian cystectomy is a procedure that is done to remove a cyst on an ovary. This procedure may be done for cysts that are large, cause symptoms, or do not go away on their own. It may also be done for a cyst that is cancerous or might be cancerous. Follow instructions from your health care provider about eating and drinking before the procedure. After the cyst is removed, it will be sent to the lab for testing. This information is not intended to replace advice given to you by your health care provider. Make sure you discuss any questions you have with your healthcare provider. Document Revised: 10/21/2019 Document Reviewed: 10/21/2019 Elsevier Patient Education  2022 [employer].

## 2020-11-23 ENCOUNTER — Other Ambulatory Visit: Payer: Self-pay

## 2020-11-23 ENCOUNTER — Other Ambulatory Visit
Admission: RE | Admit: 2020-11-23 | Discharge: 2020-11-23 | Disposition: A | Discharge status: Home / Self Care | Payer: BC Managed Care – PPO | Source: Ambulatory Visit | Attending: Obstetrics and Gynecology | Admitting: Obstetrics and Gynecology

## 2020-11-23 HISTORY — DX: Anemia, unspecified: D64.9

## 2020-11-23 NOTE — Patient Instructions (Addendum)
Your procedure is scheduled on: Thursday November 30, 2020. Report to Day Surgery inside [facility] 2nd floor (stop by admissions desk first before getting on elevator). To find out your arrival time please call [phone] between 1PM - 3PM on Wednesday November 29, 2020.  Remember: Instructions that are not followed completely may result in serious medical risk,  up to and including death, or upon the discretion of your surgeon and anesthesiologist your  surgery may need to be rescheduled.     _X__ 1. Do not eat food after midnight the night before your procedure.                 No chewing gum or hard candies. You may drink clear liquids up to 2 hours                 before you are scheduled to arrive for your surgery- DO not drink clear                 liquids within 2 hours of the start of your surgery.                 Clear Liquids include:  water, apple juice without pulp, clear Gatorade, G2 or                  Gatorade Zero (avoid Red/Purple/Blue), Black Coffee or Tea (Do not add                 anything to coffee or tea).  __X__2.   Complete the "Ensure Clear Pre-surgery Clear Carbohydrate Drink" provided to you, 2 hours before arrival. **If you are diabetic you will be provided with an alternative drink, Gatorade Zero or G2.  __X__3.  On the morning of surgery brush your teeth with toothpaste and water, you                may rinse your mouth with mouthwash if you wish.  Do not swallow any toothpaste of mouthwash.     _X__ 4.  No Alcohol for 24 hours before or after surgery.   _X__ 5.  Do Not Smoke or use e-cigarettes For 24 Hours Prior to Your Surgery.                 Do not use any chewable tobacco products for at least 6 hours prior to                 Surgery.  _X__  6.  Do not use any recreational drugs (marijuana, cocaine, heroin, ecstasy, MDMA or other)                For at least one week prior to your surgery.  Combination of these drugs with  anesthesia                May have life threatening results.  __X__  7.  Notify your doctor if there is any change in your medical condition      (cold, fever, infections).     Do not wear jewelry, make-up, hairpins, clips or nail polish. Do not wear lotions, powders, or perfumes. You may wear deodorant. Do not shave 48 hours prior to surgery.  Do not bring valuables to the hospital.    [facility] is not responsible for any belongings or valuables.  Contacts, dentures or bridgework may not be worn into surgery. Leave your suitcase in the car. After surgery it may be brought  to your room. For patients admitted to the hospital, discharge time is determined by your treatment team.   Patients discharged the day of surgery will not be allowed to drive home.   Make arrangements for someone to be with you for the first 24 hours of your Same Day Discharge.   __X__ Take these medicines the morning of surgery with A SIP OF WATER:    1. famotidine (PEPCID) 20 MG  2.   3.   4.  5.  6.  ____ Fleet Enema (as directed)   __X__ Use CHG Soap (or wipes) as directed  ____ Use Benzoyl Peroxide Gel as instructed  ____ Use inhalers on the day of surgery  ____ Stop metformin 2 days prior to surgery    ____ Take 1/2 of usual insulin dose the night before surgery. No insulin the morning          of surgery.   ____ Call your PCP, cardiologist, or Pulmonologist if taking Coumadin/Plavix/aspirin and ask when to stop before your surgery.   __X__ One Week prior to surgery- Stop Anti-inflammatories such as Ibuprofen, Aleve, Advil, Motrin, meloxicam (MOBIC), diclofenac, etodolac, ketorolac, Toradol, Daypro, piroxicam, Goody's or BC powders. OK TO USE TYLENOL IF NEEDED   ____ Stop supplements until after surgery.    ____ Bring C-Pap to the hospital.    If you have any questions regarding your pre-procedure instructions,  Please call Pre-admit Testing at [phone].

## 2020-11-28 ENCOUNTER — Other Ambulatory Visit: Payer: Self-pay

## 2020-11-28 ENCOUNTER — Encounter
Admission: RE | Admit: 2020-11-28 | Discharge: 2020-11-28 | Disposition: A | Discharge status: Home / Self Care | Payer: BC Managed Care – PPO | Source: Ambulatory Visit | Attending: Obstetrics and Gynecology | Admitting: Obstetrics and Gynecology

## 2020-11-28 DIAGNOSIS — Z20822 Contact with and (suspected) exposure to covid-19: Secondary | ICD-10-CM | POA: Insufficient documentation

## 2020-11-28 DIAGNOSIS — Z01812 Encounter for preprocedural laboratory examination: Secondary | ICD-10-CM | POA: Diagnosis not present

## 2020-11-28 LAB — TYPE AND SCREEN
ABO/RH(D): A POS
Antibody Screen: NEGATIVE

## 2020-11-28 LAB — CBC
HCT: 33.5 % — ABNORMAL LOW (ref 36.0–46.0)
Hemoglobin: 11.7 g/dL — ABNORMAL LOW (ref 12.0–15.0)
MCH: 31.5 pg (ref 26.0–34.0)
MCHC: 34.9 g/dL (ref 30.0–36.0)
MCV: 90.1 fL (ref 80.0–100.0)
Platelets: 281 10*3/uL (ref 150–400)
RBC: 3.72 MIL/uL — ABNORMAL LOW (ref 3.87–5.11)
RDW: 12.9 % (ref 11.5–15.5)
WBC: 4.1 10*3/uL (ref 4.0–10.5)
nRBC: 0 % (ref 0.0–0.2)

## 2020-11-28 LAB — CYTOLOGY - PAP
Comment: NEGATIVE
Diagnosis: NEGATIVE
High risk HPV: NEGATIVE

## 2020-11-29 LAB — SARS CORONAVIRUS 2 (TAT 6-24 HRS): SARS Coronavirus 2: NEGATIVE

## 2020-11-30 ENCOUNTER — Encounter
Admission: RE | Disposition: A | Discharge status: Home / Self Care | Payer: Self-pay | Source: Home / Self Care | Attending: Obstetrics and Gynecology

## 2020-11-30 ENCOUNTER — Observation Stay
Admission: RE | Admit: 2020-11-30 | Discharge: 2020-12-01 | Disposition: A | Discharge status: Home / Self Care | Payer: BC Managed Care – PPO | Attending: Obstetrics and Gynecology | Admitting: Obstetrics and Gynecology

## 2020-11-30 ENCOUNTER — Ambulatory Visit: Payer: BC Managed Care – PPO | Admitting: Anesthesiology

## 2020-11-30 ENCOUNTER — Other Ambulatory Visit: Payer: Self-pay

## 2020-11-30 ENCOUNTER — Encounter: Payer: Self-pay | Admitting: Obstetrics and Gynecology

## 2020-11-30 DIAGNOSIS — D259 Leiomyoma of uterus, unspecified: Secondary | ICD-10-CM | POA: Diagnosis not present

## 2020-11-30 DIAGNOSIS — D271 Benign neoplasm of left ovary: Secondary | ICD-10-CM | POA: Diagnosis present

## 2020-11-30 DIAGNOSIS — Z9889 Other specified postprocedural states: Secondary | ICD-10-CM

## 2020-11-30 DIAGNOSIS — Z8742 Personal history of other diseases of the female genital tract: Secondary | ICD-10-CM

## 2020-11-30 HISTORY — PX: ROBOTIC ASSISTED LAPAROSCOPIC OVARIAN CYSTECTOMY: SHX6081

## 2020-11-30 LAB — POCT PREGNANCY, URINE: Preg Test, Ur: NEGATIVE

## 2020-11-30 SURGERY — EXCISION, CYST, OVARY, ROBOT-ASSISTED, LAPAROSCOPIC
Anesthesia: General | Laterality: Left

## 2020-11-30 MED ORDER — ACETAMINOPHEN 500 MG PO TABS: 1000.0000 mg | ORAL_TABLET | Freq: Four times a day (QID) | ORAL | 0 refills | Status: AC | PRN

## 2020-11-30 MED ORDER — LACTATED RINGERS IV SOLN: INTRAVENOUS | Status: DC

## 2020-11-30 MED ORDER — SIMETHICONE 80 MG PO CHEW
80.0000 mg | CHEWABLE_TABLET | Freq: Four times a day (QID) | ORAL | Status: DC
  Administered 2020-11-30 – 2020-12-01 (×3): 80 mg via ORAL
  Filled 2020-11-30 (×3): qty 1

## 2020-11-30 MED ORDER — SUGAMMADEX SODIUM 200 MG/2ML IV SOLN
INTRAVENOUS | Status: DC | PRN
  Administered 2020-11-30: 200 mg via INTRAVENOUS

## 2020-11-30 MED ORDER — POLYETHYLENE GLYCOL 3350 17 G PO PACK
17.0000 g | PACK | Freq: Every day | ORAL | Status: DC | PRN
  Filled 2020-11-30: qty 1

## 2020-11-30 MED ORDER — SODIUM CHLORIDE 0.9 % IR SOLN
Status: DC | PRN
  Administered 2020-11-30 (×2): 1000 mL

## 2020-11-30 MED ORDER — ACETAMINOPHEN 10 MG/ML IV SOLN
INTRAVENOUS | Status: AC
  Filled 2020-11-30: qty 100

## 2020-11-30 MED ORDER — LIDOCAINE HCL (CARDIAC) PF 100 MG/5ML IV SOSY
PREFILLED_SYRINGE | INTRAVENOUS | Status: DC | PRN
  Administered 2020-11-30: 60 mg via INTRAVENOUS

## 2020-11-30 MED ORDER — POVIDONE-IODINE 10 % EX SWAB: 2.0000 "application " | Freq: Once | CUTANEOUS | Status: DC

## 2020-11-30 MED ORDER — KETOROLAC TROMETHAMINE 30 MG/ML IJ SOLN
INTRAMUSCULAR | Status: DC | PRN
  Administered 2020-11-30: 30 mg via INTRAVENOUS

## 2020-11-30 MED ORDER — OXYCODONE HCL 5 MG PO TABS: 5.0000 mg | ORAL_TABLET | ORAL | Status: DC | PRN

## 2020-11-30 MED ORDER — MIDAZOLAM HCL 2 MG/2ML IJ SOLN
INTRAMUSCULAR | Status: DC | PRN
  Administered 2020-11-30: 2 mg via INTRAVENOUS

## 2020-11-30 MED ORDER — MORPHINE SULFATE (PF) 2 MG/ML IV SOLN: 1.0000 mg | INTRAVENOUS | Status: DC | PRN

## 2020-11-30 MED ORDER — PROPOFOL 10 MG/ML IV BOLUS
INTRAVENOUS | Status: AC
  Filled 2020-11-30: qty 20

## 2020-11-30 MED ORDER — 0.9 % SODIUM CHLORIDE (POUR BTL) OPTIME
TOPICAL | Status: DC | PRN
  Administered 2020-11-30: 500 mL

## 2020-11-30 MED ORDER — CHLORHEXIDINE GLUCONATE 0.12 % MT SOLN
OROMUCOSAL | Status: AC
  Administered 2020-11-30: 15 mL via OROMUCOSAL
  Filled 2020-11-30: qty 15

## 2020-11-30 MED ORDER — OXYCODONE HCL 5 MG/5ML PO SOLN: 5.0000 mg | Freq: Once | ORAL | Status: AC | PRN

## 2020-11-30 MED ORDER — DEXAMETHASONE SODIUM PHOSPHATE 10 MG/ML IJ SOLN
INTRAMUSCULAR | Status: DC | PRN
  Administered 2020-11-30: 10 mg via INTRAVENOUS

## 2020-11-30 MED ORDER — FENTANYL CITRATE (PF) 100 MCG/2ML IJ SOLN
INTRAMUSCULAR | Status: AC
  Administered 2020-11-30: 25 ug via INTRAVENOUS
  Filled 2020-11-30: qty 2

## 2020-11-30 MED ORDER — OXYCODONE HCL 5 MG PO TABS
ORAL_TABLET | ORAL | Status: AC
  Administered 2020-11-30: 5 mg via ORAL
  Filled 2020-11-30: qty 1

## 2020-11-30 MED ORDER — DOCUSATE SODIUM 100 MG PO CAPS
100.0000 mg | ORAL_CAPSULE | Freq: Two times a day (BID) | ORAL | Status: DC
  Administered 2020-11-30 – 2020-12-01 (×2): 100 mg via ORAL
  Filled 2020-11-30 (×2): qty 1

## 2020-11-30 MED ORDER — BUPIVACAINE LIPOSOME 1.3 % IJ SUSP
INTRAMUSCULAR | Status: AC
  Filled 2020-11-30: qty 20

## 2020-11-30 MED ORDER — CHLORHEXIDINE GLUCONATE 0.12 % MT SOLN: 15.0000 mL | Freq: Once | OROMUCOSAL | Status: AC

## 2020-11-30 MED ORDER — ORAL CARE MOUTH RINSE: 15.0000 mL | Freq: Once | OROMUCOSAL | Status: AC

## 2020-11-30 MED ORDER — BUPIVACAINE LIPOSOME 1.3 % IJ SUSP
INTRAMUSCULAR | Status: DC | PRN
  Administered 2020-11-30: 20 mL

## 2020-11-30 MED ORDER — ONDANSETRON HCL 4 MG/2ML IJ SOLN
INTRAMUSCULAR | Status: DC | PRN
  Administered 2020-11-30: 4 mg via INTRAVENOUS

## 2020-11-30 MED ORDER — IBUPROFEN 600 MG PO TABS
600.0000 mg | ORAL_TABLET | Freq: Four times a day (QID) | ORAL | Status: DC
  Administered 2020-11-30 – 2020-12-01 (×4): 600 mg via ORAL
  Filled 2020-11-30 (×4): qty 1

## 2020-11-30 MED ORDER — FENTANYL CITRATE (PF) 100 MCG/2ML IJ SOLN
25.0000 ug | INTRAMUSCULAR | Status: DC | PRN
  Administered 2020-11-30 (×2): 25 ug via INTRAVENOUS

## 2020-11-30 MED ORDER — ACETAMINOPHEN 10 MG/ML IV SOLN
INTRAVENOUS | Status: DC | PRN
  Administered 2020-11-30: 1000 mg via INTRAVENOUS

## 2020-11-30 MED ORDER — BISACODYL 10 MG RE SUPP: 10.0000 mg | Freq: Every day | RECTAL | Status: DC | PRN

## 2020-11-30 MED ORDER — OXYCODONE HCL 5 MG PO TABS: 5.0000 mg | ORAL_TABLET | Freq: Once | ORAL | Status: AC | PRN

## 2020-11-30 MED ORDER — MIDAZOLAM HCL 2 MG/2ML IJ SOLN
INTRAMUSCULAR | Status: AC
  Filled 2020-11-30: qty 2

## 2020-11-30 MED ORDER — FENTANYL CITRATE (PF) 100 MCG/2ML IJ SOLN
INTRAMUSCULAR | Status: AC
  Filled 2020-11-30: qty 2

## 2020-11-30 MED ORDER — OXYCODONE HCL 5 MG PO TABS: 5.0000 mg | ORAL_TABLET | Freq: Three times a day (TID) | ORAL | 0 refills | Status: AC | PRN

## 2020-11-30 MED ORDER — PROPOFOL 10 MG/ML IV BOLUS
INTRAVENOUS | Status: DC | PRN
  Administered 2020-11-30: 110 mg via INTRAVENOUS

## 2020-11-30 MED ORDER — LACTATED RINGERS IV SOLN: INTRAVENOUS | Status: DC | PRN

## 2020-11-30 MED ORDER — ACETAMINOPHEN 500 MG PO TABS
1000.0000 mg | ORAL_TABLET | Freq: Four times a day (QID) | ORAL | Status: DC
  Administered 2020-11-30 – 2020-12-01 (×4): 1000 mg via ORAL
  Filled 2020-11-30 (×4): qty 2

## 2020-11-30 MED ORDER — FENTANYL CITRATE (PF) 100 MCG/2ML IJ SOLN
INTRAMUSCULAR | Status: DC | PRN
  Administered 2020-11-30 (×2): 50 ug via INTRAVENOUS

## 2020-11-30 MED ORDER — ROCURONIUM BROMIDE 100 MG/10ML IV SOLN
INTRAVENOUS | Status: DC | PRN
  Administered 2020-11-30: 10 mg via INTRAVENOUS
  Administered 2020-11-30: 20 mg via INTRAVENOUS
  Administered 2020-11-30: 50 mg via INTRAVENOUS

## 2020-11-30 MED ORDER — IBUPROFEN 600 MG PO TABS: 600.0000 mg | ORAL_TABLET | Freq: Four times a day (QID) | ORAL | 0 refills | Status: AC | PRN

## 2020-11-30 MED ORDER — PHENYLEPHRINE HCL (PRESSORS) 10 MG/ML IV SOLN
INTRAVENOUS | Status: DC | PRN
  Administered 2020-11-30: 200 ug via INTRAVENOUS
  Administered 2020-11-30: 100 ug via INTRAVENOUS
  Administered 2020-11-30: 200 ug via INTRAVENOUS

## 2020-11-30 SURGICAL SUPPLY — 93 items
APPLICATOR ARISTA FLEXITIP XL (MISCELLANEOUS) IMPLANT
BACTOSHIELD CHG 4% 4OZ (MISCELLANEOUS) ×1
BAG INFUSER PRESSURE 100CC (MISCELLANEOUS) ×2 IMPLANT
BAG LAPAROSCOPIC 12 15 PORT 16 (BASKET) IMPLANT
BAG RETRIEVAL 10 (BASKET) ×1
BAG RETRIEVAL 12/15 (BASKET)
BAG URINE DRAIN 2000ML AR STRL (UROLOGICAL SUPPLIES) ×2 IMPLANT
BASIN GRAD PLASTIC 32OZ STRL (MISCELLANEOUS) ×2 IMPLANT
BLADE SURG 11 STRL SS SAFETY (MISCELLANEOUS) IMPLANT
BLADE SURG SZ10 CARB STEEL (BLADE) ×2 IMPLANT
BLADE SURG SZ11 CARB STEEL (BLADE) ×2 IMPLANT
CANNULA REDUC XI 12-8 STAPL (CANNULA) ×1
CANNULA REDUCER 12-8 DVNC XI (CANNULA) ×1 IMPLANT
CATH FOLEY 2WAY  5CC 16FR (CATHETERS) ×1
CATH URTH 16FR FL 2W BLN LF (CATHETERS) ×1 IMPLANT
CHLORAPREP W/TINT 26 (MISCELLANEOUS) ×2 IMPLANT
COVER BACK TABLE REUSABLE LG (DRAPES) ×2 IMPLANT
COVER LIGHT HANDLE STERIS (MISCELLANEOUS) ×2 IMPLANT
COVER MAYO STAND REUSABLE (DRAPES) ×2 IMPLANT
COVER TIP SHEARS 8 DVNC (MISCELLANEOUS) ×1 IMPLANT
COVER TIP SHEARS 8MM DA VINCI (MISCELLANEOUS) ×1
DEFOGGER SCOPE WARMER CLEARIFY (MISCELLANEOUS) ×2 IMPLANT
DERMABOND ADVANCED (GAUZE/BANDAGES/DRESSINGS) ×1
DERMABOND ADVANCED .7 DNX12 (GAUZE/BANDAGES/DRESSINGS) ×1 IMPLANT
DRAPE 3/4 80X56 (DRAPES) ×4 IMPLANT
DRAPE ARM DVNC X/XI (DISPOSABLE) ×4 IMPLANT
DRAPE COLUMN DVNC XI (DISPOSABLE) ×1 IMPLANT
DRAPE DA VINCI XI ARM (DISPOSABLE) ×4
DRAPE DA VINCI XI COLUMN (DISPOSABLE) ×1
DRAPE UNDER BUTTOCK W/FLU (DRAPES) ×2 IMPLANT
DRSG TEGADERM 2-3/8X2-3/4 SM (GAUZE/BANDAGES/DRESSINGS) ×10 IMPLANT
DRSG TEGADERM 6X8 (GAUZE/BANDAGES/DRESSINGS) ×2 IMPLANT
ELECT REM PT RETURN 9FT ADLT (ELECTROSURGICAL) ×2
ELECTRODE REM PT RTRN 9FT ADLT (ELECTROSURGICAL) ×1 IMPLANT
EXTRT SYSTEM ALEXIS 17CM (MISCELLANEOUS)
GAUZE 4X4 16PLY ~~LOC~~+RFID DBL (SPONGE) ×4 IMPLANT
GLOVE SURG ENC MOIS LTX SZ7 (GLOVE) ×12 IMPLANT
GLOVE SURG UNDER POLY LF SZ7.5 (GLOVE) ×12 IMPLANT
GOWN STRL REUS W/ TWL LRG LVL3 (GOWN DISPOSABLE) ×8 IMPLANT
GOWN STRL REUS W/TWL LRG LVL3 (GOWN DISPOSABLE) ×8
GRASPER SUT TROCAR 14GX15 (MISCELLANEOUS) ×2 IMPLANT
HEMOSTAT ARISTA ABSORB 3G PWDR (HEMOSTASIS) IMPLANT
IRRIGATION STRYKERFLOW (MISCELLANEOUS) ×1 IMPLANT
IRRIGATOR STRYKERFLOW (MISCELLANEOUS) ×2
IRRIGATOR SUCT 8 DISP DVNC XI (IRRIGATION / IRRIGATOR) ×1 IMPLANT
IRRIGATOR SUCTION 8MM XI DISP (IRRIGATION / IRRIGATOR) ×1
IV NS 1000ML (IV SOLUTION)
IV NS 1000ML BAXH (IV SOLUTION) IMPLANT
KIT PINK PAD W/HEAD ARE REST (MISCELLANEOUS) ×2
KIT PINK PAD W/HEAD ARM REST (MISCELLANEOUS) ×1 IMPLANT
LABEL OR SOLS (LABEL) ×2 IMPLANT
MANIFOLD NEPTUNE II (INSTRUMENTS) ×2 IMPLANT
MANIPULATOR VCARE SML CRV RETR (MISCELLANEOUS) ×2 IMPLANT
MARKER SKIN DUAL TIP RULER LAB (MISCELLANEOUS) ×2 IMPLANT
NEEDLE HYPO 22GX1.5 SAFETY (NEEDLE) ×2 IMPLANT
NS IRRIG 1000ML POUR BTL (IV SOLUTION) ×2 IMPLANT
OBTURATOR OPTICAL STANDARD 8MM (TROCAR) ×1
OBTURATOR OPTICAL STND 8 DVNC (TROCAR) ×1
OBTURATOR OPTICALSTD 8 DVNC (TROCAR) ×1 IMPLANT
PACK GYN LAPAROSCOPIC (MISCELLANEOUS) ×2 IMPLANT
PAD ARMBOARD 7.5X6 YLW CONV (MISCELLANEOUS) ×2 IMPLANT
PAD OB MATERNITY 4.3X12.25 (PERSONAL CARE ITEMS) ×2 IMPLANT
PAD PREP 24X41 OB/GYN DISP (PERSONAL CARE ITEMS) ×2 IMPLANT
RETRACTOR WOUND ALXS 18CM SML (MISCELLANEOUS) IMPLANT
RTRCTR WOUND ALEXIS O 18CM SML (MISCELLANEOUS)
SCRUB CHG 4% DYNA-HEX 4OZ (MISCELLANEOUS) ×1 IMPLANT
SEAL CANN UNIV 5-8 DVNC XI (MISCELLANEOUS) ×3 IMPLANT
SEAL XI 5MM-8MM UNIVERSAL (MISCELLANEOUS) ×3
SEALER VESSEL DA VINCI XI (MISCELLANEOUS)
SEALER VESSEL EXT DVNC XI (MISCELLANEOUS) IMPLANT
SET CYSTO W/LG BORE CLAMP LF (SET/KITS/TRAYS/PACK) IMPLANT
SET TRI-LUMEN FLTR TB AIRSEAL (TUBING) ×2 IMPLANT
SOLUTION ELECTROLUBE (MISCELLANEOUS) ×2 IMPLANT
SPONGE GAUZE 2X2 8PLY STRL LF (GAUZE/BANDAGES/DRESSINGS) ×8 IMPLANT
SPONGE T-LAP 18X18 ~~LOC~~+RFID (SPONGE) ×2 IMPLANT
STAPLER CANNULA SEAL DVNC XI (STAPLE) ×1 IMPLANT
STAPLER CANNULA SEAL XI (STAPLE) ×1
SURGILUBE 2OZ TUBE FLIPTOP (MISCELLANEOUS) ×2 IMPLANT
SUT DVC VLOC 180 0 12IN GS21 (SUTURE)
SUT MNCRL 4-0 (SUTURE) ×2
SUT MNCRL 4-0 27XMFL (SUTURE) ×2
SUT VIC AB 0 CT1 36 (SUTURE) ×2 IMPLANT
SUT VLOC 180 0 6IN GS21 (SUTURE) IMPLANT
SUTURE DVC VLC 180 0 12IN GS21 (SUTURE) IMPLANT
SUTURE MNCRL 4-0 27XMF (SUTURE) ×2 IMPLANT
SYR 10ML LL (SYRINGE) ×2 IMPLANT
SYR 50ML LL SCALE MARK (SYRINGE) ×2 IMPLANT
SYS BAG RETRIEVAL 10MM (BASKET) ×1
SYSTEM BAG RETRIEVAL 10MM (BASKET) ×1 IMPLANT
SYSTEM CONTND EXTRCTN KII BLLN (MISCELLANEOUS) IMPLANT
TROCAR PORT AIRSEAL 5X120 (TROCAR) IMPLANT
TROCAR PORT AIRSEAL 8X100 (TROCAR) IMPLANT
TROCAR XCEL NON-BLD 5MMX100MML (ENDOMECHANICALS) IMPLANT

## 2020-11-30 NOTE — Transfer of Care (Signed)
Immediate Anesthesia Transfer of Care Note  Patient: Julie Larson  Procedure(s) Performed: XI ROBOTIC ASSISTED LAPAROSCOPIC OVARIAN CYSTECTOMY (Left)  Patient Location: PACU  Anesthesia Type:General  Level of Consciousness: awake, alert  and oriented  Airway & Oxygen Therapy: Patient Spontanous Breathing and Patient connected to face mask oxygen  Post-op Assessment: Report given to RN and Post -op Vital signs reviewed and stable  Post vital signs: Reviewed and stable  Last Vitals:  Vitals Value Taken Time  BP 112/69 11/30/20 1115  Temp 36 C 11/30/20 1112  Pulse 72 11/30/20 1115  Resp 17 11/30/20 1117  SpO2 98 % 11/30/20 1115  Vitals shown include unvalidated device data.  Last Pain:  Vitals:   11/30/20 0634  TempSrc: Oral  PainSc: 0-No pain         Complications: No notable events documented.

## 2020-11-30 NOTE — Interval H&P Note (Signed)
History and Physical Interval Note:  11/30/2020 7:18 AM  Julie Larson  has presented today for surgery, with the diagnosis of Teratoma left ovary.  The various methods of treatment have been discussed with the patient and family. After consideration of risks, benefits and other options for treatment, the patient has consented to  Procedure(s): XI ROBOTIC [place] (Left) as a surgical intervention.  The patient's history has been reviewed, patient examined, no change in status, stable for surgery.  I have reviewed the patient's chart and labs.  Questions were answered to the patient's satisfaction.     [place]

## 2020-11-30 NOTE — Op Note (Signed)
  Operative Note    PRE-OP DIAGNOSIS: Left ovarian dermoid cyst   POST-OP DIAGNOSIS: Left ovarian dermoid cyst  SURGEON: Adrian Prows MD  ASSISTANT: None  ANESTHESIA: General  PROCEDURE: Xi Robotic-Assisted Laparoscopic left ovarian Cystectomy     ESTIMATED BLOOD LOSS: 5 cc  DRAINS: Foley  SPECIMENS: Left ovarian dermoid cyst  COMPLICATIONS: None  DISPOSITION: PACU  CONDITION: Stable  INDICATIONS: Left ovarian dermoid cyst  FINDINGS: Exam under anesthesia revealed: Enlarged fibroid uterus sounded to 12 cm.  Left ovarian cyst. Intraoperative findings included:  The upper abdomen was normal including omentum, bowel, liver, stomach, and diaphragmatic surfaces.   PROCEDURE IN DETAIL: After informed consent was obtained, the patient was taken to the operating room where anesthesia was obtained without difficulty. The patient was positioned in the dorsal lithotomy position in [place] and her arms were carefully tucked at her sides and the usual precautions were taken.  She was prepped and draped in normal sterile fashion.  Time-out was performed. The patient was prepped and draped in normal sterile fashion.A foley catheter was placed.  A speculum was placed in the vagina and the cervical os was dilator. The uterus sounded to 12 cm.  A standard v-care uterine manipulator was then placed in the uterus without incident.    Operative entry was obtained via a supraumbilical incision and direct entry. The Optiview port was placed, abdomen insuffulated, and pelvis visualized with noted findings above.  The patient was placed in Trendelenburg and the bowel was displaced up into the upper abdomen.  The robotic ports and LUQ port placement, and robotic docking was performed.   The anterior surface of the ovarian cyst was incised. The cyst wall was dissected and removed.  Rupture of the dermoid cyst occurred with removal and copious irrigation was performed to remove all fatty  substance and hair.  The left ovarian dermoid cyst wall was placed into a laparoscopic bag and removed through the 12 mm umbilical port.  The ovary was inspected and was hemostatic.    Once the procedure was completed all instruments were removed and the robot was undocked. The trocars were removed.  The umbilical 12 mm port was removed and the fascia was closed using the PMI device.  The skin incision were closed using 4-0 monocryl with a subcuticular stitch and Indermil glue.  The patient tolerated the procedure well.  Sponge, lap and needle counts were correct x2.  The patient was taken to recovery room in excellent condition.  Adrian Prows MD, [facility], [facility] 11/30/2020 10:55 AM

## 2020-11-30 NOTE — Anesthesia Postprocedure Evaluation (Signed)
Anesthesia Post Note  Patient: Julie Larson  Procedure(s) Performed: XI ROBOTIC ASSISTED LAPAROSCOPIC OVARIAN CYSTECTOMY (Left)  Patient location during evaluation: PACU Anesthesia Type: General Level of consciousness: awake and alert Pain management: pain level controlled Vital Signs Assessment: post-procedure vital signs reviewed and stable Respiratory status: spontaneous breathing, nonlabored ventilation, respiratory function stable and patient connected to nasal cannula oxygen Cardiovascular status: blood pressure returned to baseline and stable Postop Assessment: no apparent nausea or vomiting Anesthetic complications: no   No notable events documented.   Last Vitals:  Vitals:   11/30/20 1145 11/30/20 1200  BP: 106/62 105/70  Pulse: 71 69  Resp: 11 10  Temp: (!) 36.4 C   SpO2: 94% 94%    Last Pain:  Vitals:   11/30/20 1200  TempSrc:   PainSc: 4                  Precious Haws Ahlaya Ende

## 2020-11-30 NOTE — Anesthesia Procedure Notes (Signed)
Procedure Name: Intubation Date/Time: 11/30/2020 7:41 AM Performed by: Philbert Riser, CRNA Pre-anesthesia Checklist: Patient identified, Emergency Drugs available, Suction available and Patient being monitored Patient Re-evaluated:Patient Re-evaluated prior to induction Oxygen Delivery Method: Circle system utilized Preoxygenation: Pre-oxygenation with 100% oxygen Induction Type: IV induction Ventilation: Mask ventilation without difficulty Tube type: Oral Tube size: 7.0 mm Number of attempts: 1 Airway Equipment and Method: Stylet and Oral airway Placement Confirmation: ETT inserted through vocal cords under direct vision, positive ETCO2 and breath sounds checked- equal and bilateral Secured at: 19 cm Tube secured with: Tape Dental Injury: Teeth and Oropharynx as per pre-operative assessment

## 2020-11-30 NOTE — Anesthesia Preprocedure Evaluation (Signed)
Anesthesia Evaluation  Patient identified by MRN, date of birth, ID band Patient awake    Reviewed: Allergy & Precautions, NPO status , Patient's Chart, lab work & pertinent test results  History of Anesthesia Complications Negative for: history of anesthetic complications  Airway Mallampati: II  TM Distance: >3 FB Neck ROM: full    Dental  (+) Chipped   Pulmonary neg pulmonary ROS, neg shortness of breath,    Pulmonary exam normal        Cardiovascular Exercise Tolerance: Good (-) angina(-) Past MI and (-) DOE negative cardio ROS Normal cardiovascular exam     Neuro/Psych negative neurological ROS  negative psych ROS   GI/Hepatic negative GI ROS, Neg liver ROS, neg GERD  ,  Endo/Other  negative endocrine ROS  Renal/GU      Musculoskeletal   Abdominal   Peds  Hematology  (+) Blood dyscrasia, anemia ,   Anesthesia Other Findings Past Medical History: No date: Anemia  History reviewed. No pertinent surgical history.  BMI    Body Mass Index: 23.44 kg/m      Reproductive/Obstetrics negative OB ROS                             Anesthesia Physical Anesthesia Plan  ASA: 2  Anesthesia Plan: General ETT   Post-op Pain Management:    Induction: Intravenous  PONV Risk Score and Plan: Ondansetron, Dexamethasone, Midazolam and Treatment may vary due to age or medical condition  Airway Management Planned: Oral ETT  Additional Equipment:   Intra-op Plan:   Post-operative Plan: Extubation in OR  Informed Consent: I have reviewed the patients History and Physical, chart, labs and discussed the procedure including the risks, benefits and alternatives for the proposed anesthesia with the patient or authorized representative who has indicated his/her understanding and acceptance.     Dental Advisory Given  Plan Discussed with: Anesthesiologist, CRNA and Surgeon  Anesthesia Plan  Comments: (Patient consented for risks of anesthesia including but not limited to:  - adverse reactions to medications - damage to eyes, teeth, lips or other oral mucosa - nerve damage due to positioning  - sore throat or hoarseness - Damage to heart, brain, nerves, lungs, other parts of body or loss of life  Patient voiced understanding.)        Anesthesia Quick Evaluation

## 2020-12-01 ENCOUNTER — Encounter: Payer: Self-pay | Admitting: Obstetrics and Gynecology

## 2020-12-01 DIAGNOSIS — D271 Benign neoplasm of left ovary: Secondary | ICD-10-CM | POA: Diagnosis not present

## 2020-12-01 LAB — CBC
HCT: 29.2 % — ABNORMAL LOW (ref 36.0–46.0)
Hemoglobin: 10.1 g/dL — ABNORMAL LOW (ref 12.0–15.0)
MCH: 31.4 pg (ref 26.0–34.0)
MCHC: 34.6 g/dL (ref 30.0–36.0)
MCV: 90.7 fL (ref 80.0–100.0)
Platelets: 279 10*3/uL (ref 150–400)
RBC: 3.22 MIL/uL — ABNORMAL LOW (ref 3.87–5.11)
RDW: 13.2 % (ref 11.5–15.5)
WBC: 11.6 10*3/uL — ABNORMAL HIGH (ref 4.0–10.5)
nRBC: 0 % (ref 0.0–0.2)

## 2020-12-01 MED ORDER — TRANEXAMIC ACID 650 MG PO TABS: 1300.0000 mg | ORAL_TABLET | Freq: Three times a day (TID) | ORAL | 2 refills | Status: AC

## 2020-12-01 MED ORDER — POLYETHYLENE GLYCOL 3350 17 G PO PACK: 17.0000 g | PACK | Freq: Every day | ORAL | 0 refills | Status: AC | PRN

## 2020-12-01 MED ORDER — BISACODYL 10 MG RE SUPP: 10.0000 mg | Freq: Every day | RECTAL | 0 refills | Status: AC | PRN

## 2020-12-01 MED ORDER — SIMETHICONE 80 MG PO CHEW: 80.0000 mg | CHEWABLE_TABLET | Freq: Four times a day (QID) | ORAL | 0 refills | Status: AC

## 2020-12-01 MED ORDER — DOCUSATE SODIUM 100 MG PO CAPS: 100.0000 mg | ORAL_CAPSULE | Freq: Two times a day (BID) | ORAL | 0 refills | Status: AC

## 2020-12-01 NOTE — Discharge Summary (Signed)
Physician Discharge Summary  Patient ID: Helene Bernstein MRN: [id number] DOB/AGE: March 10, 1976 45 y.o.  Admit date: 11/30/2020 Discharge date: 12/01/2020  Admission Diagnoses:  Discharge Diagnoses:  Active Problems:   Dermoid cyst of ovary, left   S/P ovarian cystectomy   Discharged Condition: good  Hospital Course: She had a normal postoperative course. She was able to eat, pain was controlled and she is ambulating on her own.  Consults: None  Significant Diagnostic Studies: labs: see EPIC  Treatments: IV hydration  Discharge Exam: Blood pressure 107/61, pulse 72, temperature 98.6 F (37 C), temperature source Oral, resp. rate 18, height 5' (1.524 m), weight 54.4 kg, last menstrual period 11/22/2020, SpO2 100 %. General appearance: alert, cooperative, and appears stated age Resp: clear to auscultation bilaterally Incision/Wound:  Disposition: Discharge disposition: 01-Home or Self Care     Discharge Instructions     Call MD for:  difficulty breathing, headache or visual disturbances   Complete by: As directed    Call MD for:  difficulty breathing, headache or visual disturbances   Complete by: As directed    Call MD for:  extreme fatigue   Complete by: As directed    Call MD for:  extreme fatigue   Complete by: As directed    Call MD for:  hives   Complete by: As directed    Call MD for:  hives   Complete by: As directed    Call MD for:  persistant dizziness or light-headedness   Complete by: As directed    Call MD for:  persistant dizziness or light-headedness   Complete by: As directed    Call MD for:  persistant nausea and vomiting   Complete by: As directed    Call MD for:  persistant nausea and vomiting   Complete by: As directed    Call MD for:  redness, tenderness, or signs of infection (pain, swelling, redness, odor or green/yellow discharge around incision site)   Complete by: As directed    Call MD for:  redness, tenderness, or signs of infection (pain,  swelling, redness, odor or green/yellow discharge around incision site)   Complete by: As directed    Call MD for:  severe uncontrolled pain   Complete by: As directed    Call MD for:  severe uncontrolled pain   Complete by: As directed    Call MD for:  temperature >100.4   Complete by: As directed    Call MD for:  temperature >100.4   Complete by: As directed    Diet general   Complete by: As directed    Driving Restrictions   Complete by: As directed    Do not drive while taking narcotic medications   Driving Restrictions   Complete by: As directed    Do not drive while taking narcotic medications   Increase activity slowly   Complete by: As directed    Increase activity slowly   Complete by: As directed    May shower / Bathe   Complete by: As directed    May shower / Bathe   Complete by: As directed    Remove dressing in 24 hours   Complete by: As directed    Remove dressing in 24 hours   Complete by: As directed       Allergies as of 12/01/2020   No Known Allergies      Medication List     TAKE these medications    acetaminophen 500 MG tablet Commonly known as:  TYLENOL Take 2 tablets (1,000 mg total) by mouth every 6 (six) hours as needed.   bisacodyl 10 MG suppository Commonly known as: DULCOLAX Place 1 suppository (10 mg total) rectally daily as needed for moderate constipation.   docusate sodium 100 MG capsule Commonly known as: COLACE Take 1 capsule (100 mg total) by mouth 2 (two) times daily.   famotidine 20 MG tablet Commonly known as: PEPCID Take 20 mg by mouth every other day as needed for heartburn or indigestion.   ferrous gluconate 324 MG tablet Commonly known as: FERGON Take 1 tablet (324 mg total) by mouth daily with breakfast.   folic acid 1 MG tablet Commonly known as: FOLVITE Take 2 tablets (2 mg total) by mouth daily. What changed: when to take this   ibuprofen 600 MG tablet Commonly known as: ADVIL Take 1 tablet (600 mg total)  by mouth every 6 (six) hours as needed.   oxyCODONE 5 MG immediate release tablet Commonly known as: Roxicodone Take 1 tablet (5 mg total) by mouth every 8 (eight) hours as needed.   polyethylene glycol 17 g packet Commonly known as: MIRALAX / GLYCOLAX Take 17 g by mouth daily as needed for mild constipation.   simethicone 80 MG chewable tablet Commonly known as: MYLICON Chew 1 tablet (80 mg total) by mouth 4 (four) times daily.   tranexamic acid 650 MG Tabs tablet Commonly known as: LYSTEDA Take 2 tablets (1,300 mg total) by mouth 3 (three) times daily. Take during menses for a maximum of five days        Follow-up Information     Amayra Kiedrowski R, MD. Schedule an appointment as soon as possible for a visit in 1 week(s).   Specialty: Obstetrics and Gynecology Why: For incision check Contact information: [address]. [place] [address] [phone]                 Signed: Homero Fellers 12/01/2020, 8:29 AM

## 2020-12-01 NOTE — Progress Notes (Signed)
Pt discharged home. Discharge instructions, prescriptions, and follow up appointments given to and reviewed with pt. Pt verbalized understanding. Escorted by axillary.

## 2020-12-01 NOTE — Progress Notes (Signed)
  Subjective:  She is feeling well. Pain control is controlled. Voiding without difficulty. Tolerating a regular diet. Ambulating well.  Objective:   Blood pressure 107/61, pulse 72, temperature 98.6 F (37 C), temperature source Oral, resp. rate 18, height 5' (1.524 m), weight 54.4 kg, last menstrual period 11/22/2020, SpO2 100 %.  General: NAD Pulmonary: no increased work of breathing Abdomen: non-distended, non-tender. Incisions are clean, dry, and intact.  Extremities: no edema, no erythema, no tenderness, no signs of DVT  Results for orders placed or performed during the hospital encounter of 11/30/20 (from the past 72 hour(s))  Pregnancy, urine POC     Status: None   Collection Time: 11/30/20  6:31 AM  Result Value Ref Range   Preg Test, Ur NEGATIVE NEGATIVE    Comment:        THE SENSITIVITY OF THIS METHODOLOGY IS >24 mIU/mL   CBC     Status: Abnormal   Collection Time: 12/01/20  5:26 AM  Result Value Ref Range   WBC 11.6 (H) 4.0 - 10.5 K/uL   RBC 3.22 (L) 3.87 - 5.11 MIL/uL   Hemoglobin 10.1 (L) 12.0 - 15.0 g/dL   HCT 29.2 (L) 36.0 - 46.0 %   MCV 90.7 80.0 - 100.0 fL   MCH 31.4 26.0 - 34.0 pg   MCHC 34.6 30.0 - 36.0 g/dL   RDW 13.2 11.5 - 15.5 %   Platelets 279 150 - 400 K/uL   nRBC 0.0 0.0 - 0.2 %    Comment: Performed at [facility], [address] [place], [place] [address]    Assessment:   45 y.o. postoperative day # 1  Plan:   1) Tolerating regular diet 2) Pain controlled with motrin and tylenol 3) Plan follow up next week in office 4) Discussed plan for menorrhagia from fibroids, she does not have embolization scheduled yet. Will send Rx for TXA for bleeding during menses until embolization is performed.  Adrian Prows MD [facility], [facility] 12/01/2020 8:15 AM

## 2020-12-04 LAB — SURGICAL PATHOLOGY

## 2020-12-08 ENCOUNTER — Ambulatory Visit (INDEPENDENT_AMBULATORY_CARE_PROVIDER_SITE_OTHER): Payer: BC Managed Care – PPO | Admitting: Obstetrics and Gynecology

## 2020-12-08 ENCOUNTER — Encounter: Payer: Self-pay | Admitting: Obstetrics and Gynecology

## 2020-12-08 ENCOUNTER — Other Ambulatory Visit: Payer: Self-pay

## 2020-12-08 VITALS — BP 116/70 | Ht 60.0 in | Wt 121.2 lb

## 2020-12-08 DIAGNOSIS — Z8742 Personal history of other diseases of the female genital tract: Secondary | ICD-10-CM

## 2020-12-08 DIAGNOSIS — Z9889 Other specified postprocedural states: Secondary | ICD-10-CM

## 2020-12-08 NOTE — Progress Notes (Signed)
  Postoperative Follow-up Patient presents post op from  [place]- assisted Laparoscopic Ovarian Cystectomy  for  left ovarian dermoid , 1 week ago.  Subjective: Patient reports some improvement in her preop symptoms. Eating a regular diet without difficulty. Pain is controlled with current analgesics. Medications being used: acetaminophen and ibuprofen (OTC).  Activity: sedentary. Patient reports additional symptom's since surgery of abdominal bloating and dysuria that is now resolved.  Objective: BP 116/70   Ht 5' (1.524 m)   Wt 121 lb 3.2 oz (55 kg)   LMP 11/22/2020 (Exact Date)   BMI 23.67 kg/m  Physical Exam Constitutional:      Appearance: Normal appearance. She is well-developed.  HENT:     Head: Normocephalic and atraumatic.  Eyes:     Extraocular Movements: Extraocular movements intact.     Pupils: Pupils are equal, round, and reactive to light.  Neck:     Thyroid: No thyromegaly.  Cardiovascular:     Rate and Rhythm: Normal rate and regular rhythm.     Heart sounds: Normal heart sounds.  Pulmonary:     Effort: Pulmonary effort is normal.     Breath sounds: Normal breath sounds.  Abdominal:     General: Bowel sounds are normal. There is no distension.     Palpations: Abdomen is soft. There is no mass.     Comments: Incisions are clean, dry intact  Musculoskeletal:     Cervical back: Neck supple.  Neurological:     Mental Status: She is alert and oriented to person, place, and time.  Skin:    General: Skin is warm and dry.  Psychiatric:        Behavior: Behavior normal.        Thought Content: Thought content normal.        Judgment: Judgment normal.  Vitals reviewed.    Assessment: s/p :  [place] [facility]- assisted Laparoscopic Ovarian Cystectomy stable  Plan: Patient has done well after surgery with no apparent complications.  I have discussed the post-operative course to date, and the expected progress moving forward.  The patient understands what complications to  be concerned about.  I will see the patient in routine follow up, or sooner if needed.    Activity plan: No heavy lifting.  Pelvic rest.  Adrian Prows MD, [place], [facility] 12/08/2020 2:01 PM

## 2021-01-08 ENCOUNTER — Encounter: Payer: Self-pay | Admitting: Obstetrics and Gynecology

## 2021-01-08 ENCOUNTER — Ambulatory Visit (INDEPENDENT_AMBULATORY_CARE_PROVIDER_SITE_OTHER): Payer: BC Managed Care – PPO | Admitting: Obstetrics and Gynecology

## 2021-01-08 ENCOUNTER — Other Ambulatory Visit: Payer: Self-pay

## 2021-01-08 VITALS — BP 109/68 | Ht 60.0 in | Wt 123.8 lb

## 2021-01-08 DIAGNOSIS — Z9889 Other specified postprocedural states: Secondary | ICD-10-CM

## 2021-01-08 DIAGNOSIS — Z8742 Personal history of other diseases of the female genital tract: Secondary | ICD-10-CM

## 2021-01-08 NOTE — Progress Notes (Signed)
  Postoperative Follow-up Patient presents post op from  [place]- assisted Laparoscopic Ovarian Cystectomy for  left ovarian dermoid , 1 week ago.  Subjective: Patient reports marked improvement in her preop symptoms. Eating a regular diet without difficulty. Pain is controlled without any medications.  Activity: normal activities of daily living. Patient reports additional symptom's since surgery of None.  She is planning a fibroid embolization procedure for November during Thanksgiving break.  She reports that her last menstrual cycle was lighter.  She does have lysteda at home to take if needed for heavy bleeding.  Objective: BP 109/68   Ht 5' (1.524 m)   Wt 123 lb 12.8 oz (56.2 kg)   LMP 12/14/2020   BMI 24.18 kg/m  Physical Exam Constitutional:      Appearance: Normal appearance. She is well-developed.  Genitourinary:     Genitourinary Comments:    HENT:     Head: Normocephalic and atraumatic.  Eyes:     Extraocular Movements: Extraocular movements intact.     Pupils: Pupils are equal, round, and reactive to light.  Neck:     Thyroid: No thyromegaly.  Cardiovascular:     Rate and Rhythm: Normal rate and regular rhythm.     Heart sounds: Normal heart sounds.  Pulmonary:     Effort: Pulmonary effort is normal.     Breath sounds: Normal breath sounds.  Abdominal:     General: Bowel sounds are normal. There is no distension.     Palpations: Abdomen is soft. There is no mass.     Comments: Incisions are clean dry and intact  Musculoskeletal:     Cervical back: Neck supple.  Neurological:     Mental Status: She is alert and oriented to person, place, and time.  Skin:    General: Skin is warm and dry.  Psychiatric:        Behavior: Behavior normal.        Thought Content: Thought content normal.        Judgment: Judgment normal.  Vitals reviewed.    Assessment: s/p :   [place] [facility]- assisted Laparoscopic Ovarian Cystectomy   stable  Plan: Patient has done well after  surgery with no apparent complications.  I have discussed the post-operative course to date, and the expected progress moving forward.  The patient understands what complications to be concerned about.  I will see the patient in routine follow up, or sooner if needed.    Activity plan: No restriction.    Adrian Prows MD, [place], [facility] 01/08/2021 3:22 PM

## 2021-07-11 ENCOUNTER — Ambulatory Visit: Payer: BC Managed Care – PPO | Admitting: Obstetrics and Gynecology

## 2021-08-17 ENCOUNTER — Encounter: Payer: Self-pay | Admitting: Obstetrics and Gynecology

## 2021-08-17 ENCOUNTER — Other Ambulatory Visit: Payer: Self-pay

## 2021-08-17 ENCOUNTER — Ambulatory Visit: Payer: BC Managed Care – PPO | Admitting: Obstetrics and Gynecology

## 2021-08-17 VITALS — BP 100/70 | Ht 65.0 in | Wt 135.0 lb

## 2021-08-17 DIAGNOSIS — N920 Excessive and frequent menstruation with regular cycle: Secondary | ICD-10-CM

## 2021-08-17 DIAGNOSIS — D259 Leiomyoma of uterus, unspecified: Secondary | ICD-10-CM

## 2021-08-17 NOTE — Progress Notes (Signed)
? ?Patient ID: Julie Larson, female   DOB: [birth year], 46 y.o.   MRN: [id number] ? ?Reason for Consult: Follow-up ?  ?Referred by Lucianne Lei, MD ? ?Subjective:  ?   ?HPI: ? ?Julie Larson is a 46 y.o. female she is here for 89-monthfollow-up regarding her menorrhagia.  She reports that her bleeding has been significantly better since she had her dermoid cyst removed.  She was supposed to undergo an uterine artery embolization and November however this did not work out.  She still plans to follow[place] providers in [place] for the uterine artery/fibroid  embolization. ? ?She feels significantly as fatigued than before.  In the past she reports that she would have 3 to 4 days of heavy bleeding and now this bleeding is confined to 1 to 1.5 days. ? ?Gynecological History ? ?Patient's last menstrual period was 07/25/2021. ? ?Past Medical History:  ?Diagnosis Date  ? Anemia   ? ?Family History  ?Problem Relation Age of Onset  ? Diabetes Father   ? Hypertension Father   ? ?Past Surgical History:  ?Procedure Laterality Date  ? [facility]OTIC ASSISTED LAPAROSCOPIC OVARIAN CYSTECTOMY Left 11/30/2020  ? Procedure: [facility] ROBOTIC ASSISTED LAPAROSCOPIC OVARIAN CYSTECTOMY;  Surgeon: SHomero Fellers MD;  Location: [facility] ORS;  Service: Gynecology;  Laterality: Left;  ? ? ?Short Social History:  ?Social History  ? ?Tobacco Use  ? Smoking status: Never  ? Smokeless tobacco: Never  ?Substance Use Topics  ? Alcohol use: Never  ? ? ?No Known Allergies ? ?Current Outpatient Medications  ?Medication Sig Dispense Refill  ? folic acid (FOLVITE) 1 MG tablet Take 2 tablets (2 mg total) by mouth daily. 60 tablet 2  ? acetaminophen (TYLENOL) 500 MG tablet Take 2 tablets (1,000 mg total) by mouth every 6 (six) hours as needed. (Patient not taking: Reported on 01/08/2021) 60 tablet 0  ? bisacodyl (DULCOLAX) 10 MG suppository Place 1 suppository (10 mg total) rectally daily as needed for moderate constipation. (Patient not taking: Reported on  01/08/2021) 12 suppository 0  ? docusate sodium (COLACE) 100 MG capsule Take 1 capsule (100 mg total) by mouth 2 (two) times daily. (Patient not taking: Reported on 01/08/2021) 10 capsule 0  ? famotidine (PEPCID) 20 MG tablet Take 20 mg by mouth every other day as needed for heartburn or indigestion. (Patient not taking: Reported on 01/08/2021)    ? ferrous gluconate (FERGON) 324 MG tablet Take 1 tablet (324 mg total) by mouth daily with breakfast. (Patient not taking: Reported on 08/17/2021) 30 tablet 2  ? ibuprofen (ADVIL) 600 MG tablet Take 1 tablet (600 mg total) by mouth every 6 (six) hours as needed. (Patient not taking: Reported on 01/08/2021) 60 tablet 0  ? oxyCODONE (ROXICODONE) 5 MG immediate release tablet Take 1 tablet (5 mg total) by mouth every 8 (eight) hours as needed. (Patient not taking: Reported on 12/08/2020) 20 tablet 0  ? polyethylene glycol (MIRALAX / GLYCOLAX) 17 g packet Take 17 g by mouth daily as needed for mild constipation. (Patient not taking: Reported on 12/08/2020) 14 each 0  ? simethicone (MYLICON) 80 MG chewable tablet Chew 1 tablet (80 mg total) by mouth 4 (four) times daily. (Patient not taking: Reported on 01/08/2021) 30 tablet 0  ? tranexamic acid (LYSTEDA) 650 MG TABS tablet Take 2 tablets (1,300 mg total) by mouth 3 (three) times daily. Take during menses for a maximum of five days (Patient not taking: Reported on 12/08/2020) 30 tablet 2  ? ?No  current facility-administered medications for this visit.  ? ? ?Review of Systems  ?Constitutional: Negative for chills, fatigue, fever and unexpected weight change.  ?HENT: Negative for trouble swallowing.  ?Eyes: Negative for loss of vision.  ?Respiratory: Negative for cough, shortness of breath and wheezing.  ?Cardiovascular: Negative for chest pain, leg swelling, palpitations and syncope.  ?GI: Negative for abdominal pain, blood in stool, diarrhea, nausea and vomiting.  ?GU: Negative for difficulty urinating, dysuria, frequency and  hematuria.  ?Musculoskeletal: Negative for back pain, leg pain and joint pain.  ?Skin: Negative for rash.  ?Neurological: Negative for dizziness, headaches, light-headedness, numbness and seizures.  ?Psychiatric: Negative for behavioral problem, confusion, depressed mood and sleep disturbance.   ? ?   ?Objective:  ?Objective  ? ?Vitals:  ? 08/17/21 1125  ?BP: 100/70  ?Weight: 135 lb (61.2 kg)  ?Height: '5\' 5"'$  (1.651 m)  ? ?Body mass index is 22.47 kg/m?. ? ?Physical Exam ?Vitals and nursing note reviewed. Exam conducted with a chaperone present.  ?Constitutional:   ?   Appearance: Normal appearance.  ?HENT:  ?   Head: Normocephalic and atraumatic.  ?Eyes:  ?   Extraocular Movements: Extraocular movements intact.  ?   Pupils: Pupils are equal, round, and reactive to light.  ?Cardiovascular:  ?   Rate and Rhythm: Normal rate and regular rhythm.  ?Pulmonary:  ?   Effort: Pulmonary effort is normal.  ?   Breath sounds: Normal breath sounds.  ?Abdominal:  ?   General: Abdomen is flat.  ?   Palpations: Abdomen is soft.  ?Musculoskeletal:  ?   Cervical back: Normal range of motion.  ?Skin: ?   General: Skin is warm and dry.  ?Neurological:  ?   General: No focal deficit present.  ?   Mental Status: She is alert and oriented to person, place, and time.  ?Psychiatric:     ?   Behavior: Behavior normal.     ?   Thought Content: Thought content normal.     ?   Judgment: Judgment normal.  ? ? ?Assessment/Plan:  ?  ? ?46 year old with history of menorrhagia secondary to enlarged fibroid uterus. ?Encouraged the patient to follow through with her fibroid embolization or consider alternative [facility] procedure for fibroid ablation.  ?Pap smear is up to date ?Follow up as needed ? ?More than 10 minutes were spent face to face with the patient in the room, reviewing the medical record, labs and images, and coordinating care for the patient. The plan of management was discussed in detail and counseling was provided.  ? ? ?  ? ?Adrian Prows MD ?[facility], [facility] ?08/17/2021 ?12:17 PM ? ? ?

## 2021-08-17 NOTE — Patient Instructions (Signed)
[facility]? Laparoscopic Radiofrequency Ablation (Lap-RFA) ? ?[facility]  ?

## 2022-04-11 ENCOUNTER — Other Ambulatory Visit: Payer: Self-pay | Admitting: Obstetrics and Gynecology

## 2022-04-11 DIAGNOSIS — R928 Other abnormal and inconclusive findings on diagnostic imaging of breast: Secondary | ICD-10-CM

## 2022-04-26 ENCOUNTER — Other Ambulatory Visit: Payer: Self-pay | Admitting: Obstetrics and Gynecology

## 2022-04-26 DIAGNOSIS — D259 Leiomyoma of uterus, unspecified: Secondary | ICD-10-CM

## 2022-05-14 ENCOUNTER — Ambulatory Visit
Admission: RE | Admit: 2022-05-14 | Discharge: 2022-05-14 | Disposition: A | Discharge status: Home / Self Care | Payer: BC Managed Care – PPO | Source: Ambulatory Visit | Attending: Obstetrics and Gynecology | Admitting: Obstetrics and Gynecology

## 2022-05-14 DIAGNOSIS — R928 Other abnormal and inconclusive findings on diagnostic imaging of breast: Secondary | ICD-10-CM

## 2022-06-12 ENCOUNTER — Ambulatory Visit
Admission: RE | Admit: 2022-06-12 | Discharge: 2022-06-12 | Disposition: A | Discharge status: Home / Self Care | Payer: BC Managed Care – PPO | Source: Ambulatory Visit | Attending: Obstetrics and Gynecology | Admitting: Obstetrics and Gynecology

## 2022-06-12 DIAGNOSIS — D259 Leiomyoma of uterus, unspecified: Secondary | ICD-10-CM

## 2022-06-12 NOTE — Consult Note (Signed)
Chief Complaint: Patient was seen in consultation today for symptomatic uterine fibroids  Referring Physician(s): Banga,Cecilia Worema  History of Present Illness: Julie Larson is a 47 y.o. female with uterine fibroids, menorrhagia and history of severe anemia.  Patient has had heavy menstrual bleeding for many years and was found to be severely anemic in March 2022 which required hospitalization and blood transfusion.  Since that hospitalization, the menstrual bleeding has decreased but she continues to have heavy bleeding.  Menstrual periods last approximately 7 days with 3 days of very heavy bleeding.  On days 2, 3 and 4 she has a large amount of bleeding that can occur suddenly.  She arranges her work and life schedule around these days of heavy bleeding in order to avoid having an accident.  Menstrual cramping has decreased since 2022.  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  Patient has never been pregnant but is interested in keeping her uterus at this time.  She is not actively trying to get pregnant but is not completely ruling out pregnancy for the future.  Patient is aware that she would be high risk for pregnancy just based on her age.  Patient had pelvic ultrasound in November 2023.  She is not taking any medications at this time.  She is no longer taking iron.  No other significant medical problems at this time.  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  Past Medical History:  Diagnosis Date   Anemia     Past Surgical History:  Procedure Laterality Date   ROBOTIC ASSISTED LAPAROSCOPIC OVARIAN CYSTECTOMY Left 11/30/2020   Procedure: XI ROBOTIC ASSISTED LAPAROSCOPIC OVARIAN CYSTECTOMY;  Surgeon: Homero Fellers, MD;  Location: [facility] ORS;  Service: Gynecology;  Laterality: Left;    Allergies: Patient has no known allergies.  Medications: Prior to Admission medications   Medication Sig  Start Date End Date Taking? Authorizing Provider  bisacodyl (DULCOLAX) 10 MG suppository Place 1 suppository (10 mg total) rectally daily as needed for moderate constipation. Patient not taking: Reported on 01/08/2021 12/01/20   Homero Fellers, MD  docusate sodium (COLACE) 100 MG capsule Take 1 capsule (100 mg total) by mouth 2 (two) times daily. Patient not taking: Reported on 01/08/2021 12/01/20   Homero Fellers, MD  famotidine (PEPCID) 20 MG tablet Take 20 mg by mouth every other day as needed for heartburn or indigestion. Patient not taking: Reported on 01/08/2021    [provider]  ferrous gluconate (FERGON) 324 MG tablet Take 1 tablet (324 mg total) by mouth daily with breakfast. Patient not taking: Reported on 08/17/2021 08/20/20   Dessa Phi, DO  folic acid (FOLVITE) 1 MG tablet Take 2 tablets (2 mg total) by mouth daily. 08/20/20   Dessa Phi, DO  ibuprofen (ADVIL) 600 MG tablet Take 1 tablet (600 mg total) by mouth every 6 (six) hours as needed. Patient not taking: Reported on 01/08/2021 11/30/20   Homero Fellers, MD  polyethylene glycol (MIRALAX / GLYCOLAX) 17 g packet Take 17 g by mouth daily as needed for mild constipation. Patient not taking: Reported on 12/08/2020 12/01/20   Homero Fellers, MD  simethicone (MYLICON) 80 MG chewable tablet Chew 1 tablet (80 mg total) by mouth 4 (four) times daily. Patient not taking: Reported on 01/08/2021 12/01/20   Homero Fellers, MD  tranexamic acid (LYSTEDA) 650 MG TABS tablet Take 2 tablets (1,300 mg total) by mouth 3 (  three) times daily. Take during menses for a maximum of five days Patient not taking: Reported on 12/08/2020 12/01/20   Homero Fellers, MD     Family History  Problem Relation Age of Onset   Diabetes Father    Hypertension Father     Social History   Socioeconomic History   Marital status: Single    Spouse name: Not on file   Number of children: Not on file   Years of education:  Not on file   Highest education level: Not on file  Occupational History   Not on file  Tobacco Use   Smoking status: Never   Smokeless tobacco: Never  Vaping Use   Vaping Use: Never used  Substance and Sexual Activity   Alcohol use: Never   Drug use: Never   Sexual activity: Not on file  Other Topics Concern   Not on file  Social History Narrative   Not on file   Social Determinants of Health   Financial Resource Strain: Not on file  Food Insecurity: Not on file  Transportation Needs: Not on file  Physical Activity: Not on file  Stress: Not on file  Social Connections: Not on file    ECOG Status: 1 - Symptomatic but completely ambulatory   Review of Systems  Constitutional: Negative.   Gastrointestinal:  Positive for abdominal distention.  Genitourinary:  Positive for menstrual problem. Negative for frequency.    Vital Signs: BP (!) 102/54 Comment: pt reports this is normal for her  Pulse 91   Temp 98.2 F (36.8 C) (Oral)   Wt 56.7 kg Comment: pt reported  SpO2 99% Comment: room air  BMI 20.80 kg/m     Physical Exam Constitutional:      Appearance: Normal appearance. She is normal weight.  Cardiovascular:     Rate and Rhythm: Normal rate and regular rhythm.     Pulses: Normal pulses.     Heart sounds: Normal heart sounds.  Pulmonary:     Effort: Pulmonary effort is normal.     Breath sounds: Normal breath sounds.  Abdominal:     General: There is distension.     Palpations: Abdomen is soft.     Tenderness: There is abdominal tenderness.     Comments: Tender in the anterior pelvis.  Known uterine fibroids are palpable in this area.  Musculoskeletal:     Right lower leg: No edema.     Left lower leg: No edema.  Neurological:     General: No focal deficit present.     Mental Status: She is alert and oriented to person, place, and time.       Imaging: MM DIAG BREAST TOMO UNI LEFT  Result Date: 05/14/2022 CLINICAL DATA:  Patient recalled from  screening for left breast mass. EXAM: DIGITAL DIAGNOSTIC UNILATERAL LEFT MAMMOGRAM WITH TOMOSYNTHESIS; ULTRASOUND LEFT BREAST LIMITED TECHNIQUE: Left digital diagnostic mammography and breast tomosynthesis was performed.; Targeted ultrasound examination of the left breast was performed. COMPARISON:  Previous exam(s). ACR Breast Density Category c: The breast tissue is heterogeneously dense, which may obscure small masses. FINDINGS: Within the retroareolar left breast there is a persistent 11 mm round mass, further evaluated with spot compression views. Targeted ultrasound is performed, showing a 9 x 8 x 10 mm cyst left breast 12 o'clock position 1 cm from the nipple retroareolar location. IMPRESSION: Left breast cyst. RECOMMENDATION: Screening mammogram in one year.(Code:SM-B-01Y) I have discussed the findings and recommendations with the patient. If applicable, a reminder  letter will be sent to the patient regarding the next appointment. BI-RADS CATEGORY  2: Benign. Electronically Signed   By: Lovey Newcomer M.D.   On: 05/14/2022 15:11  US BREAST LTD UNI LEFT INC AXILLA  Result Date: 05/14/2022 CLINICAL DATA:  Patient recalled from screening for left breast mass. EXAM: DIGITAL DIAGNOSTIC UNILATERAL LEFT MAMMOGRAM WITH TOMOSYNTHESIS; ULTRASOUND LEFT BREAST LIMITED TECHNIQUE: Left digital diagnostic mammography and breast tomosynthesis was performed.; Targeted ultrasound examination of the left breast was performed. COMPARISON:  Previous exam(s). ACR Breast Density Category c: The breast tissue is heterogeneously dense, which may obscure small masses. FINDINGS: Within the retroareolar left breast there is a persistent 11 mm round mass, further evaluated with spot compression views. Targeted ultrasound is performed, showing a 9 x 8 x 10 mm cyst left breast 12 o'clock position 1 cm from the nipple retroareolar location. IMPRESSION: Left breast cyst. RECOMMENDATION: Screening mammogram in one year.(Code:SM-B-01Y) I  have discussed the findings and recommendations with the patient. If applicable, a reminder letter will be sent to the patient regarding the next appointment. BI-RADS CATEGORY  2: Benign. Electronically Signed   By: Lovey Newcomer M.D.   On: 05/14/2022 15:11   Outside ultrasound from November 2023 demonstrates a uterus that measures 17 x 12 x 9 cm.  Normal ovaries.  4.9 cm left lateral subserosal calcified fibroid.  6.7 cm posterior subserosal fibroid.  5.8 cm anterior fundal intramural fibroid.  5 cm right lateral intramural fibroid.  Lining is not well-seen.   Assessment and Plan:  47 year old with multiple large uterine fibroids and menorrhagia.  Patient has a history of severe anemia that required hospitalization in 2022.  Patient feels that the menstrual bleeding has actually decreased since 2022 but she continues to have at least 3 days of very heavy bleeding.  These 3 days of heavy bleeding are work and lifestyle limiting.  Patient is very interested in having treatments for her menorrhagia.  Although the patient is not trying to get pregnant, she has some interest in retaining her fertility and does not want to have a hysterectomy.  We discussed different treatment options including hormonal therapy, hysterectomy, myomectomy and uterine artery embolization.  We discussed uterine artery embolization procedure in depth.  We discussed the technical aspects of the procedure including catheterization from a left radial or femoral artery approach.  We discussed the postprocedural recovery and postembolization syndrome.  Explained that uterine artery embolization is not the preferred method of treatment if she is very interested in future pregnancies.  I believe the patient has a very good understanding of the procedure, benefits and risks.  We also reviewed the patient's previous CT imaging from 2022.  Based on patient's prior imaging, history and examination, she is a good candidate for uterine artery  embolization.  She would require a pelvic MRI prior to the procedure to make sure there are no anatomic contraindications.  Patient would like to think about her options and she would need to consider the timing of the treatment in order to have 2 to 3 weeks for recovery.  Patient will contact us if she would like to schedule the pelvic MRI and uterine artery embolization procedure.  Thank you for this interesting consult.  I greatly enjoyed meeting Julie Larson and look forward to participating in their care.  A copy of this report was sent to the requesting provider on this date.  Electronically Signed: Burman Riis 06/12/2022, 10:26 AM   I spent a total of  80 Minutes  in face to face in clinical consultation, greater than 50% of which was counseling/coordinating care for symptomatic uterine fibroids.

## 2022-12-06 IMAGING — US US PELVIS COMPLETE
2 series · 13 of 25 positions shown · non-contrast
Comparison: None.

CLINICAL DATA: Vaginal bleeding

EXAM:
TRANSABDOMINAL ULTRASOUND OF PELVIS
DOPPLER ULTRASOUND OF OVARIES
TECHNIQUE: Transabdominal ultrasound examination of the pelvis was performed
including evaluation of the uterus, ovaries, adnexal regions, and
pelvic cul-de-sac.
Color and duplex Doppler ultrasound was utilized to evaluate blood
flow to the ovaries.

[Series 1: us pelvis (transabdominal only) · 12 of 46 slices shown]
[im 1/46]
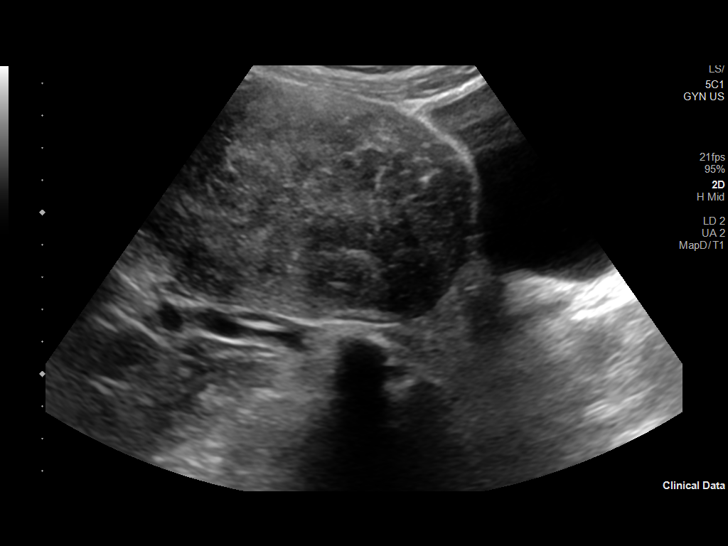
[im 4/46]
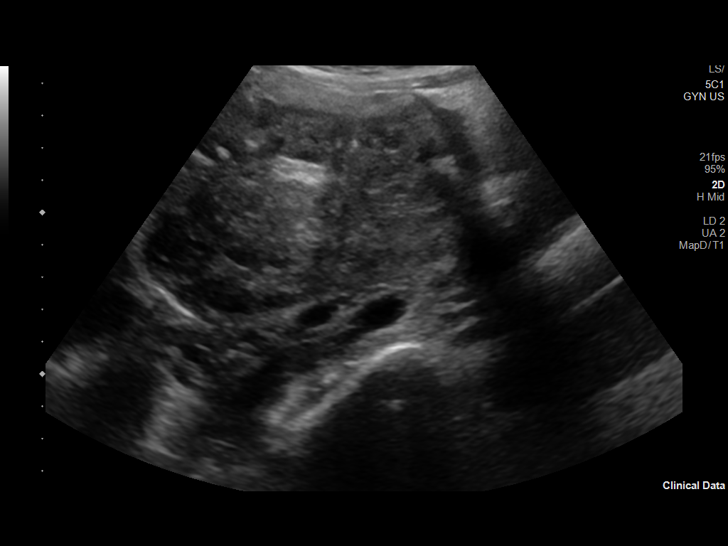
[im 8/46]
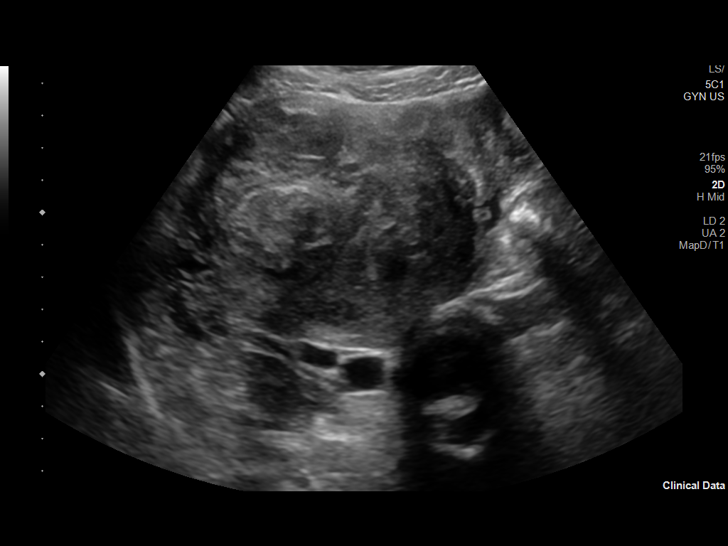
[im 12/46]
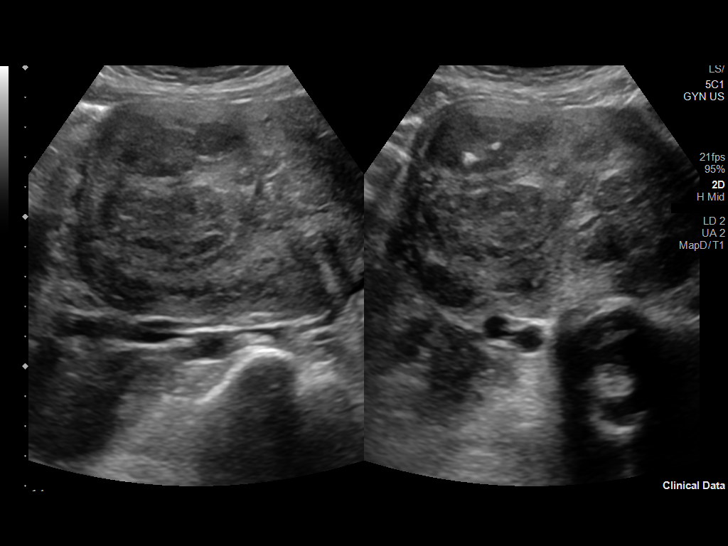
[im 16/46]
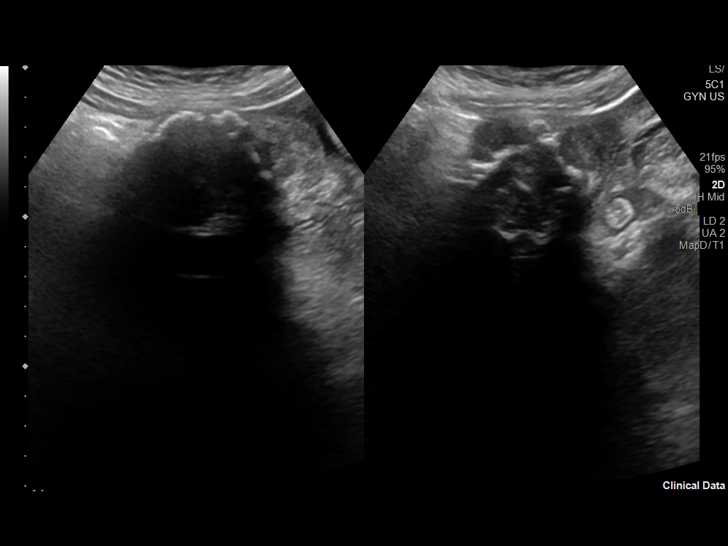
[im 20/46]
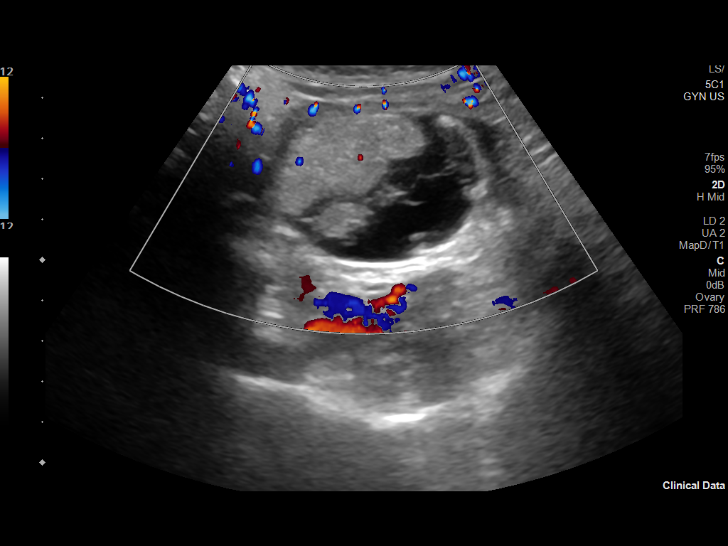
[im 24/46]
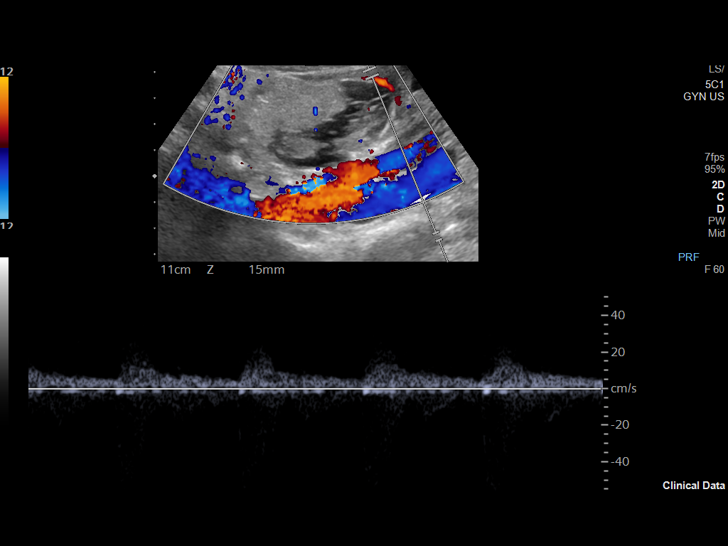
[im 28/46]
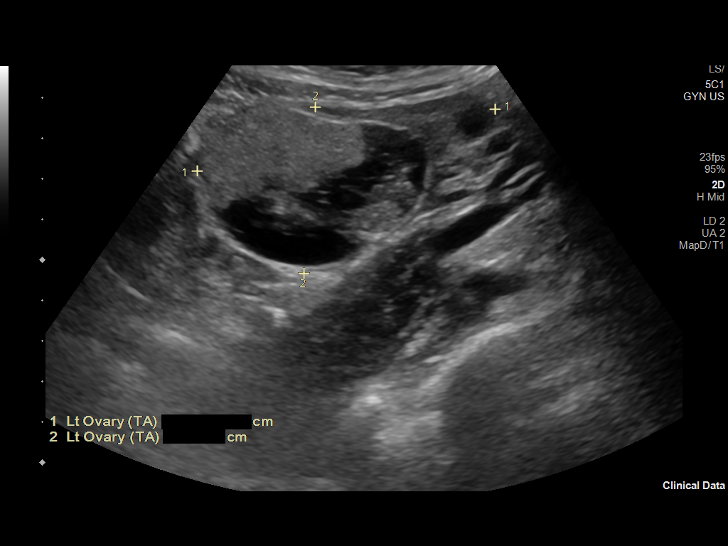
[im 32/46]
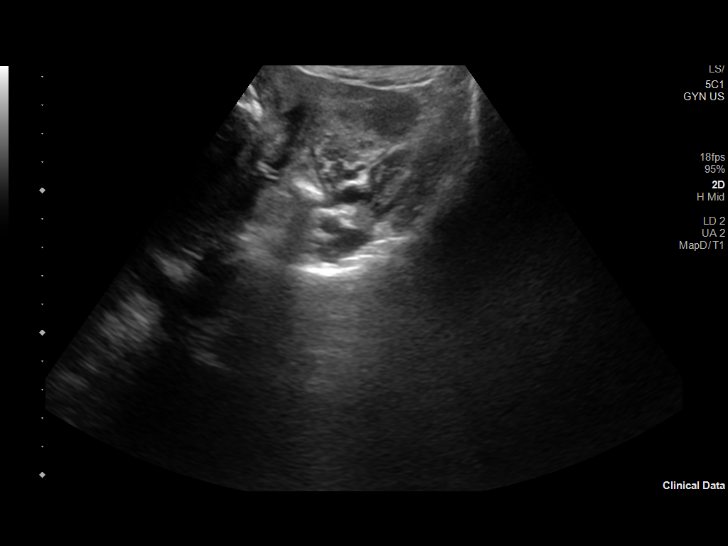
[im 36/46]
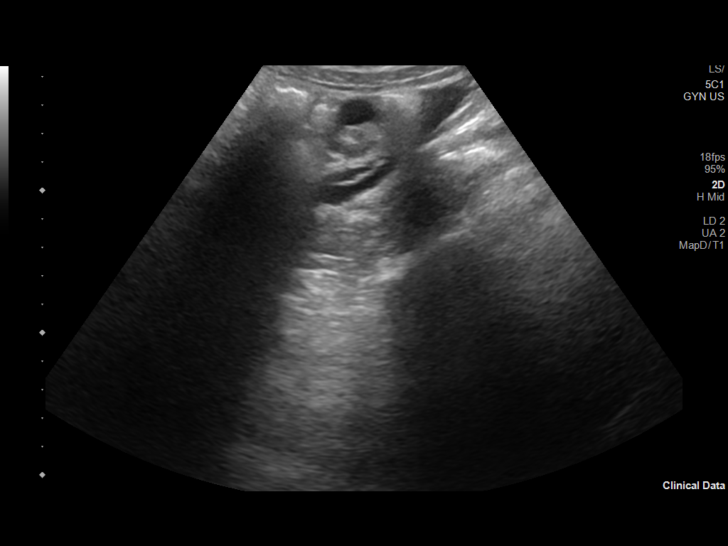
[im 40/46]
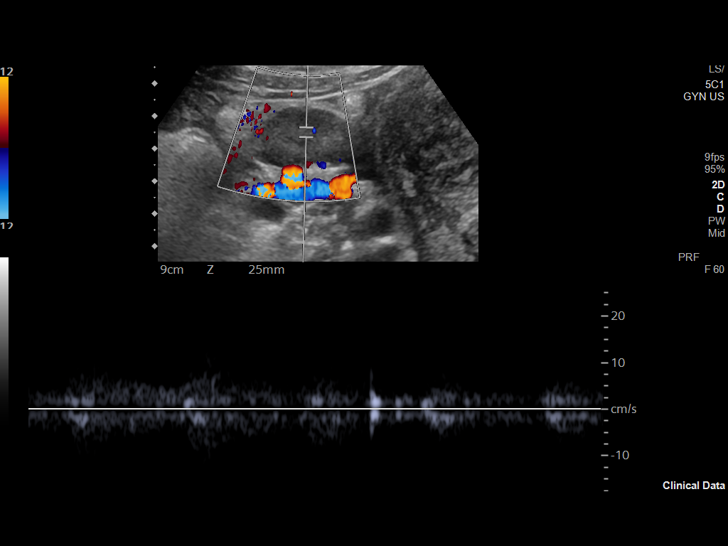
[im 44/46]
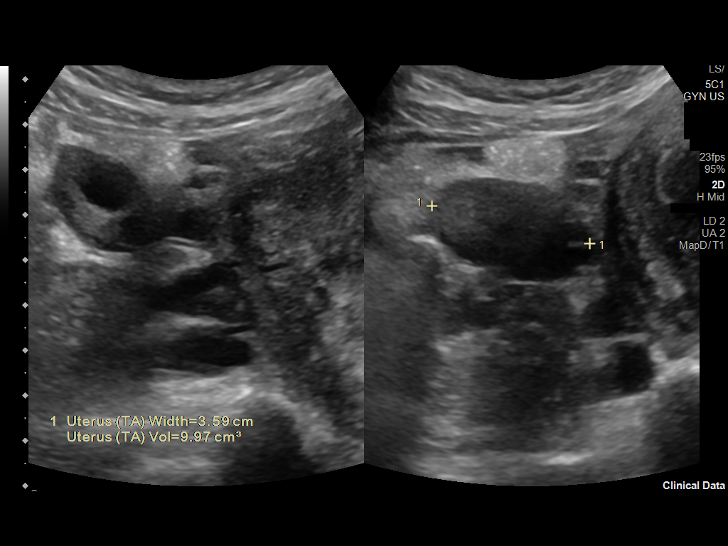

[Series 1001: gyn us · 1 of 2 slices shown]
[im 1/2]
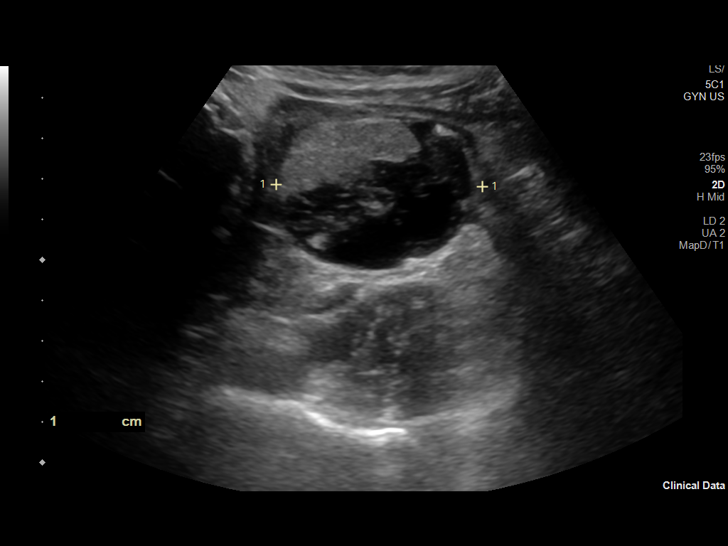

[13 of 25 positions shown; findings below may reference images not displayed]

FINDINGS: Uterus

Measurements: 14 x 7.6 x 9.8 cm = volume: 541 mL. Numerous fibroids
measuring up to 5.7 cm in the lower uterine segment.

Endometrium

Thickness: Difficult to visualize due to the numerous fibroids.

Right ovary

Measurements: 2.9 x 2.0 x 3.5 cm = volume: 10.2 mL. Normal
appearance/no adnexal mass.

Left ovary

Measurements: 7.5 x 4.1 x 6.1 cm = volume: 98 mL. 5.8 x 5.1 x 3.9 cm
complex cyst with internal echoes.

Pulsed Doppler evaluation demonstrates normal low-resistance
arterial and venous waveforms in both ovaries.

Other: No free fluid.
IMPRESSION: Enlarged fibroid uterus.

5.8 cm complex cyst in the left over, likely hemorrhagic cyst.
Recommend follow-up US in 3-6 months. Note: This recommendation does
not apply to premenarchal patients or to those with increased risk
(genetic, family history, elevated tumor markers or other high-risk
factors) of ovarian cancer. Reference: Radiology [DATE];

## 2022-12-07 IMAGING — CT CT ABD-PELV W/ CM
2 of 5 series · 15 of 46 positions shown, 17 images · IV contrast (Omni 300)
Comparison: None.

CLINICAL DATA: Nausea and vomiting.  Anemia.  Inpatient.

EXAM:
CT ABDOMEN AND PELVIS WITH CONTRAST
TECHNIQUE: Multidetector CT imaging of the abdomen and pelvis was performed
using the standard protocol following bolus administration of
intravenous contrast.
CONTRAST:  100mL OMNIPAQUE IOHEXOL 300 MG/ML  SOLN

[Series 3: a/p w/ 5mm · axial · 0.71mm/px · z∈[+1004,+1384]mm · 12 of 86 slices shown, 14 images]
[im 5/86  soft-tissue]
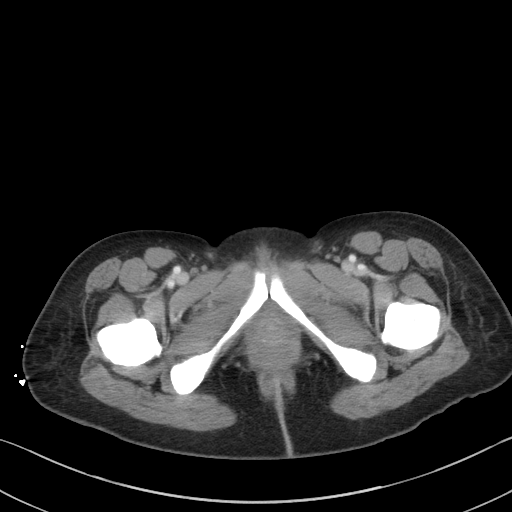
[im 5/86  bone]
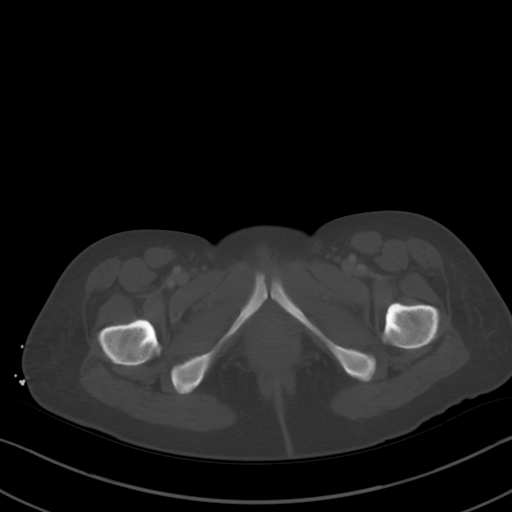
[im 14/86  soft-tissue]
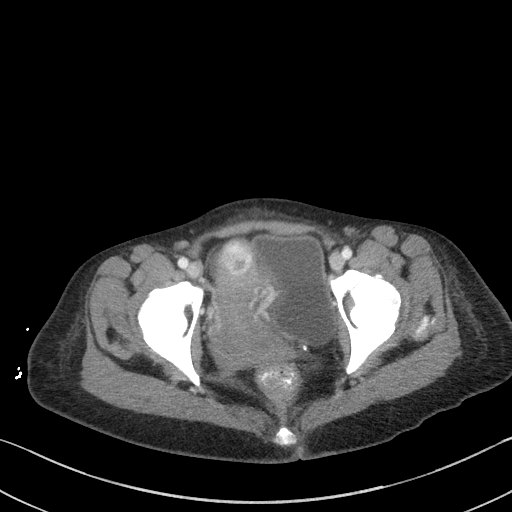
[im 18/86  soft-tissue]
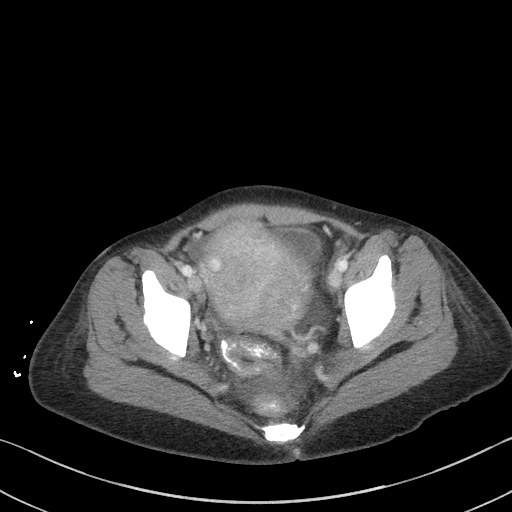
[im 27/86  soft-tissue]
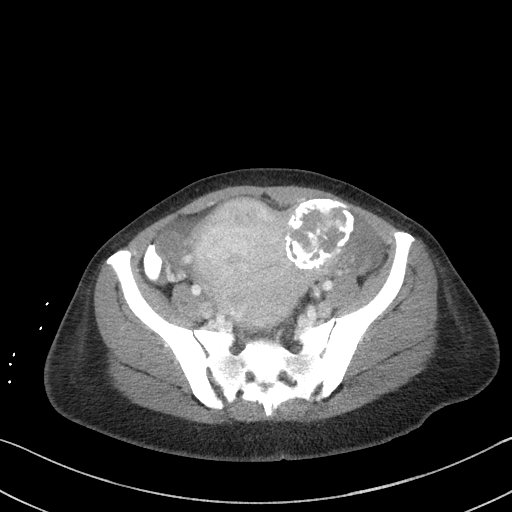
[im 32/86  soft-tissue]
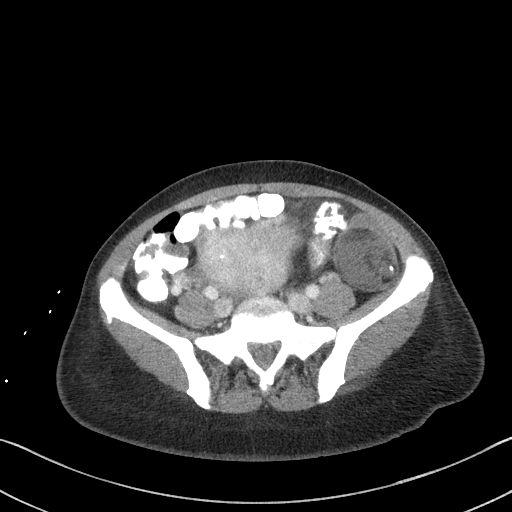
[im 41/86  soft-tissue]
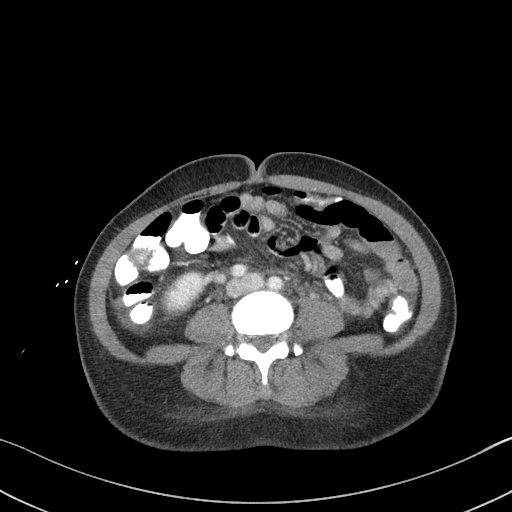
[im 45/86  soft-tissue]
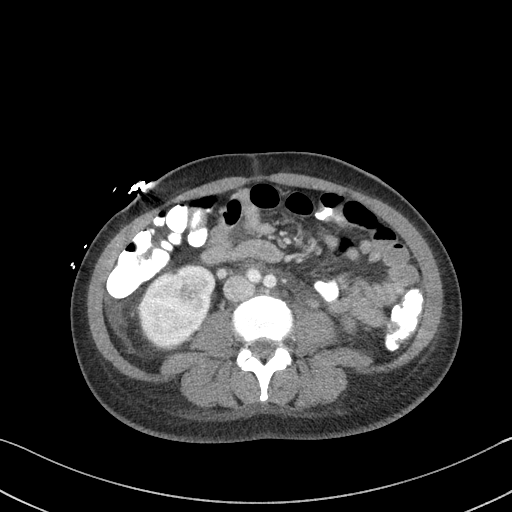
[im 54/86  soft-tissue]
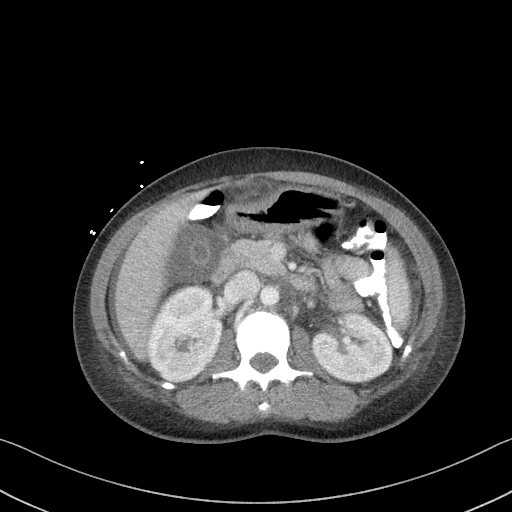
[im 59/86  soft-tissue]
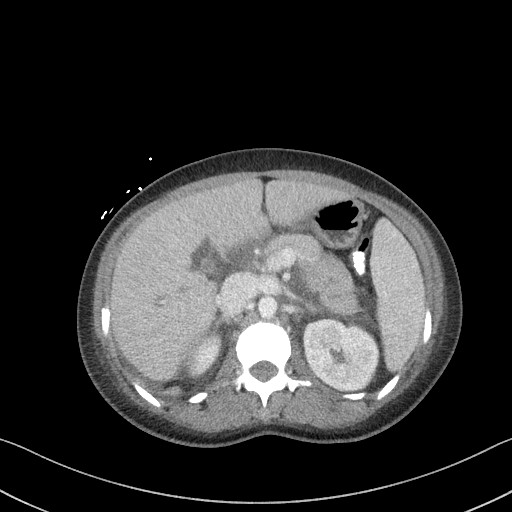
[im 59/86  bone]
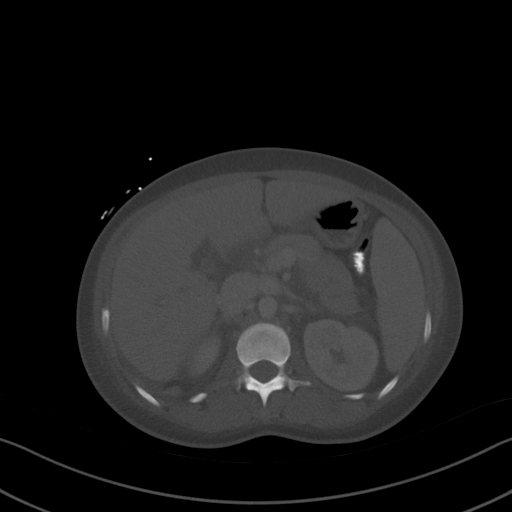
[im 68/86  soft-tissue]
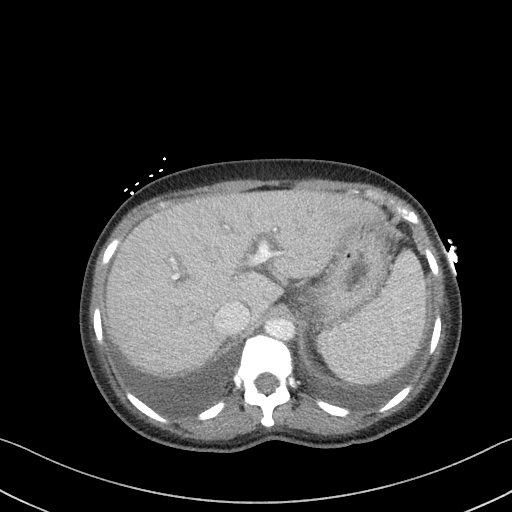
[im 72/86  soft-tissue]
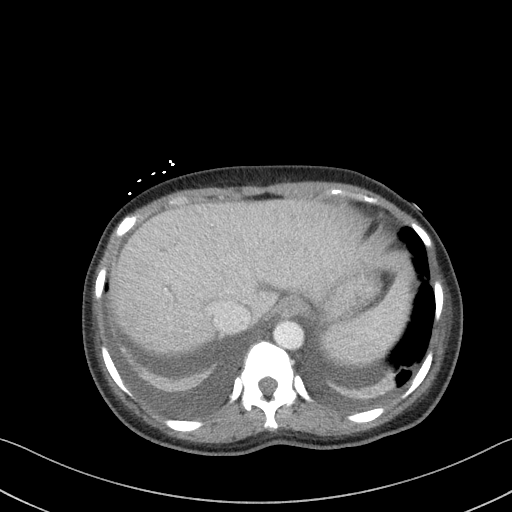
[im 81/86  soft-tissue]
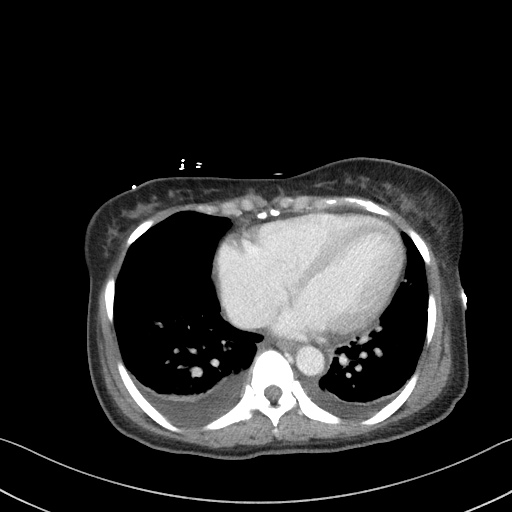

[Series 6: a/p w/ cor · coronal · 0.66mm/px · 3 of 144 slices shown]
[im 48/144  soft-tissue]
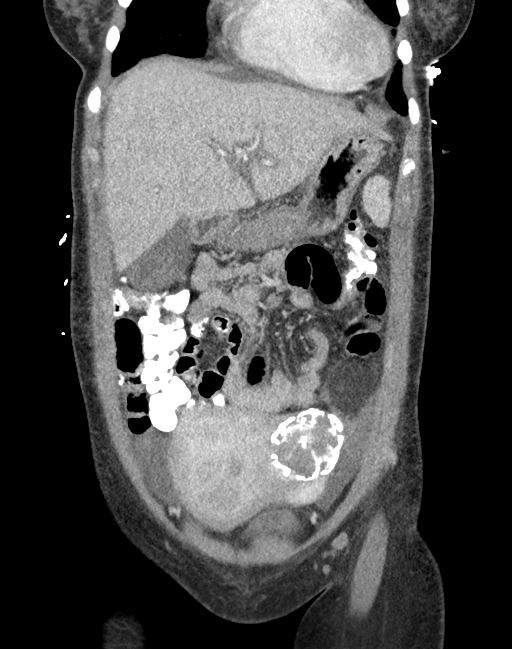
[im 64/144  soft-tissue]
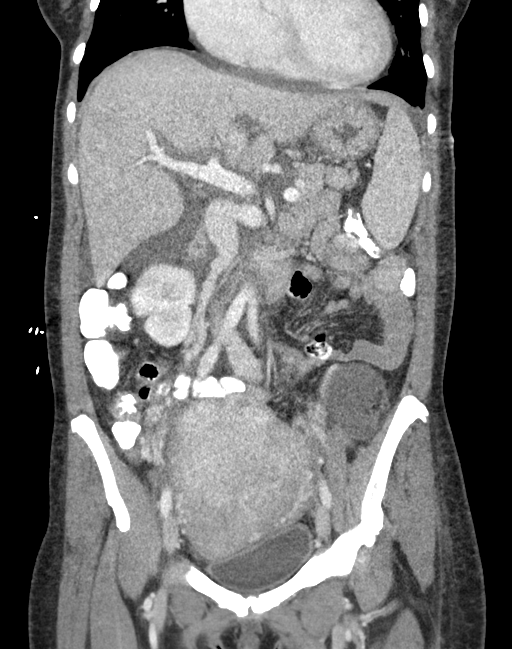
[im 80/144  soft-tissue]
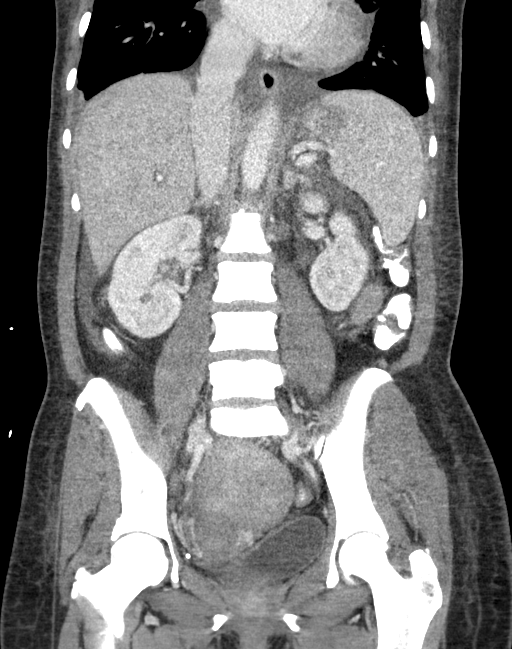

[15 of 46 positions shown; findings below may reference images not displayed]

FINDINGS: Lower chest: Small dependent bilateral pleural effusions with
passive mild dependent bibasilar atelectasis.

Hepatobiliary: Normal liver size. Subcentimeter hypodense inferior
segment 5 right liver lesion, too small to characterize, requiring
no follow-up unless the patient has risk factors for liver
malignancy. No additional liver lesions. Prominent diffuse
gallbladder wall thickening. No radiopaque cholelithiasis. No
biliary ductal dilatation. CBD diameter 3 mm. Mild diffuse
periportal edema.

Pancreas: Normal, with no mass or duct dilation.

Spleen: Normal size. No mass.

Adrenals/Urinary Tract: Normal adrenals. No renal masses. No
hydronephrosis. Normal bladder.

Stomach/Bowel: Normal non-distended stomach. Normal caliber small
bowel with no small bowel wall thickening. Normal appendix. Oral
contrast transits to the rectum. Normal large bowel with no
diverticulosis, large bowel wall thickening or pericolonic fat
stranding.

Vascular/Lymphatic: Normal caliber abdominal aorta. Patent portal,
splenic, hepatic and renal veins. No pathologically enlarged lymph
nodes in the abdomen or pelvis.

Reproductive: Enlarged myomatous uterus measures 13.1 x 8.9 x
cm with dominant coarsely calcified degenerated 5.6 cm anterior
upper left uterine fibroid. Left ovarian 5.0 x 4.6 cm mass with
mixed fluid, fat and calcific density (series 3/image 52),
compatible with a mature teratoma. No right adnexal masses.

Other: No pneumoperitoneum. Small volume ascites. No focal fluid
collections.

Musculoskeletal: No aggressive appearing focal osseous lesions.
IMPRESSION: 1. Enlarged myomatous uterus.
2. Left ovarian 5.0 cm mature teratoma.
3. Evidence of third spacing of fluid with small volume ascites,
mild periportal edema and small dependent bilateral pleural
effusions.
4. Nonspecific prominent diffuse gallbladder wall thickening, which
could be noninflammatory or inflammatory. No radiopaque
cholelithiasis. No biliary ductal dilatation. Right upper quadrant
ultrasound correlation could be obtained if there is clinical
concern for cholecystitis.
